# Patient Record
Sex: Male | Born: 1945 | Race: White | Hispanic: No | Marital: Married | Smoking: Former smoker
Health system: Southern US, Community
[De-identification: ages and names within clinical notes are randomized; demographics above are authoritative.]

## PROBLEM LIST (undated history)

## (undated) DIAGNOSIS — I10 Essential (primary) hypertension: Secondary | ICD-10-CM

## (undated) DIAGNOSIS — E785 Hyperlipidemia, unspecified: Secondary | ICD-10-CM

## (undated) DIAGNOSIS — F419 Anxiety disorder, unspecified: Secondary | ICD-10-CM

## (undated) DIAGNOSIS — H269 Unspecified cataract: Secondary | ICD-10-CM

## (undated) DIAGNOSIS — J439 Emphysema, unspecified: Secondary | ICD-10-CM

## (undated) DIAGNOSIS — K5790 Diverticulosis of intestine, part unspecified, without perforation or abscess without bleeding: Secondary | ICD-10-CM

## (undated) DIAGNOSIS — M1990 Unspecified osteoarthritis, unspecified site: Secondary | ICD-10-CM

## (undated) DIAGNOSIS — Z87438 Personal history of other diseases of male genital organs: Secondary | ICD-10-CM

## (undated) DIAGNOSIS — F17200 Nicotine dependence, unspecified, uncomplicated: Secondary | ICD-10-CM

## (undated) DIAGNOSIS — K635 Polyp of colon: Secondary | ICD-10-CM

## (undated) DIAGNOSIS — T7840XA Allergy, unspecified, initial encounter: Secondary | ICD-10-CM

## (undated) DIAGNOSIS — F32A Depression, unspecified: Secondary | ICD-10-CM

## (undated) HISTORY — DX: Diverticulosis of intestine, part unspecified, without perforation or abscess without bleeding: K57.90

## (undated) HISTORY — DX: Depression, unspecified: F32.A

## (undated) HISTORY — DX: Unspecified cataract: H26.9

## (undated) HISTORY — DX: Unspecified osteoarthritis, unspecified site: M19.90

## (undated) HISTORY — PX: EYE SURGERY: SHX253

## (undated) HISTORY — DX: Personal history of other diseases of male genital organs: Z87.438

## (undated) HISTORY — PX: COLONOSCOPY: SHX174

## (undated) HISTORY — DX: Essential (primary) hypertension: I10

## (undated) HISTORY — DX: Anxiety disorder, unspecified: F41.9

## (undated) HISTORY — PX: CATARACT EXTRACTION, BILATERAL: SHX1313

## (undated) HISTORY — DX: Allergy, unspecified, initial encounter: T78.40XA

## (undated) HISTORY — DX: Hyperlipidemia, unspecified: E78.5

## (undated) HISTORY — DX: Emphysema, unspecified: J43.9

## (undated) HISTORY — DX: Polyp of colon: K63.5

## (undated) HISTORY — DX: Nicotine dependence, unspecified, uncomplicated: F17.200

## (undated) HISTORY — PX: VASECTOMY: SHX75

---

## 2001-09-03 HISTORY — PX: ROTATOR CUFF REPAIR: SHX139

## 2001-10-06 ENCOUNTER — Encounter: Payer: Self-pay | Admitting: Orthopedic Surgery

## 2001-10-06 ENCOUNTER — Ambulatory Visit (HOSPITAL_COMMUNITY): Admission: RE | Admit: 2001-10-06 | Discharge: 2001-10-06 | Payer: Self-pay | Admitting: Orthopedic Surgery

## 2003-01-15 ENCOUNTER — Encounter: Payer: Self-pay | Admitting: Gastroenterology

## 2005-01-26 ENCOUNTER — Ambulatory Visit: Payer: Self-pay | Admitting: Gastroenterology

## 2005-02-16 ENCOUNTER — Encounter (INDEPENDENT_AMBULATORY_CARE_PROVIDER_SITE_OTHER): Payer: Self-pay | Admitting: Specialist

## 2005-02-16 ENCOUNTER — Ambulatory Visit: Payer: Self-pay | Admitting: Gastroenterology

## 2005-03-23 ENCOUNTER — Encounter: Admission: RE | Admit: 2005-03-23 | Discharge: 2005-03-23 | Payer: Self-pay | Admitting: Sports Medicine

## 2006-04-25 ENCOUNTER — Ambulatory Visit (HOSPITAL_COMMUNITY): Admission: RE | Admit: 2006-04-25 | Discharge: 2006-04-25 | Payer: Self-pay | Admitting: Urology

## 2006-04-25 ENCOUNTER — Encounter: Payer: Self-pay | Admitting: Vascular Surgery

## 2009-08-15 ENCOUNTER — Encounter: Admission: RE | Admit: 2009-08-15 | Discharge: 2009-08-15 | Payer: Self-pay | Admitting: Nephrology

## 2009-12-12 ENCOUNTER — Encounter: Admission: RE | Admit: 2009-12-12 | Discharge: 2009-12-12 | Payer: Self-pay | Admitting: Sports Medicine

## 2009-12-26 ENCOUNTER — Encounter: Admission: RE | Admit: 2009-12-26 | Discharge: 2009-12-26 | Payer: Self-pay | Admitting: Sports Medicine

## 2010-01-19 ENCOUNTER — Encounter (INDEPENDENT_AMBULATORY_CARE_PROVIDER_SITE_OTHER): Payer: Self-pay | Admitting: *Deleted

## 2010-06-23 ENCOUNTER — Encounter: Admission: RE | Admit: 2010-06-23 | Discharge: 2010-06-23 | Payer: Self-pay | Admitting: Sports Medicine

## 2010-09-06 ENCOUNTER — Encounter (INDEPENDENT_AMBULATORY_CARE_PROVIDER_SITE_OTHER): Payer: Self-pay | Admitting: *Deleted

## 2010-09-24 ENCOUNTER — Encounter: Payer: Self-pay | Admitting: Sports Medicine

## 2010-10-03 NOTE — Letter (Signed)
Summary: Colonoscopy Letter  [facility]  [address] [place], [place] [address]   Phone: [phone]  Fax: [phone]      Jan 19, 2010 MRN: [id number]   Johnny Brown [address] [place], [place]  [address]   Dear Mr. Johnny Brown,   According to your medical record, it is time for you to schedule a Colonoscopy. The American Cancer Society recommends this procedure as a method to detect early colon cancer. Patients with a family history of colon cancer, or a personal history of colon polyps or inflammatory bowel disease are at increased risk.  This letter has beeen generated based on the recommendations made at the time of your procedure. If you feel that in your particular situation this may no longer apply, please contact our office.  Please call our office at [phone] to schedule this appointment or to update your records at your earliest convenience.  Thank you for cooperating with [place] to provide you with the very best care possible.   Sincerely,    Vania Rea. Jarold Motto, M.D.  [facility] Gastroenterology Division [phone]

## 2010-10-05 NOTE — Letter (Signed)
Summary: Pre Visit Letter Revised  [facility]  [address] [place], [place] [address]   Phone: [phone]  Fax: [phone]        09/06/2010 MRN: [id number] Johnny Brown [address] [place], [place]  [address]             Procedure Date:  10-16-10   Welcome to the Gastroenterology Division at [facility].    You are scheduled to see a nurse for your pre-procedure visit on 10-02-10 at 11:00a.m. on the 3rd floor at [facility], [address]  We ask that you try to arrive at our office 15 minutes prior to your appointment time to allow for check-in.  Please take a minute to review the attached form.  If you answer "Yes" to one or more of the questions on the first page, we ask that you call the person listed at your earliest opportunity.  If you answer "No" to all of the questions, please complete the rest of the form and bring it to your appointment.    Your nurse visit will consist of discussing your medical and surgical history, your immediate family medical history, and your medications.   If you are unable to list all of your medications on the form, please bring the medication bottles to your appointment and we will list them.  We will need to be aware of both prescribed and over the counter drugs.  We will need to know exact dosage information as well.    Please be prepared to read and sign documents such as consent forms, a financial agreement, and acknowledgement forms.  If necessary, and with your consent, a friend or relative is welcome to sit-in on the nurse visit with you.  Please bring your insurance card so that we may make a copy of it.  If your insurance requires a referral to see a specialist, please bring your referral form from your primary care physician.  No co-pay is required for this nurse visit.     If you cannot keep your appointment, please call [phone] to cancel or reschedule prior to your appointment date.  This allows [place]  the opportunity to schedule an appointment for another patient in need of care.    Thank you for choosing [facility] for your medical needs.  We appreciate the opportunity to care for you.  Please visit [place] at our website  to learn more about our practice.  Sincerely, The Gastroenterology Division

## 2010-10-09 ENCOUNTER — Encounter (INDEPENDENT_AMBULATORY_CARE_PROVIDER_SITE_OTHER): Payer: Self-pay | Admitting: *Deleted

## 2010-10-10 ENCOUNTER — Encounter: Payer: Self-pay | Admitting: Gastroenterology

## 2010-10-13 ENCOUNTER — Telehealth: Payer: Self-pay | Admitting: Gastroenterology

## 2010-10-16 ENCOUNTER — Other Ambulatory Visit: Payer: Self-pay | Admitting: Gastroenterology

## 2010-10-16 ENCOUNTER — Other Ambulatory Visit (AMBULATORY_SURGERY_CENTER): Payer: BC Managed Care – PPO | Admitting: Gastroenterology

## 2010-10-16 DIAGNOSIS — Z86010 Personal history of colonic polyps: Secondary | ICD-10-CM

## 2010-10-16 DIAGNOSIS — Z1211 Encounter for screening for malignant neoplasm of colon: Secondary | ICD-10-CM

## 2010-10-16 DIAGNOSIS — D126 Benign neoplasm of colon, unspecified: Secondary | ICD-10-CM

## 2010-10-16 DIAGNOSIS — K5730 Diverticulosis of large intestine without perforation or abscess without bleeding: Secondary | ICD-10-CM

## 2010-10-19 NOTE — Miscellaneous (Signed)
Summary: [facility] Previsit/prep  Clinical Lists Changes  Medications: Added new medication of MOVIPREP 100 GM  SOLR (PEG-KCL-NACL-NASULF-NA ASC-C) As per prep instructions. - Signed Rx of MOVIPREP 100 GM  SOLR (PEG-KCL-NACL-NASULF-NA ASC-C) As per prep instructions.;  #1 x 0;  Signed;  Entered by: Wyona Almas RN;  Authorized by: Mardella Layman MD [facility];  Method used: Electronically to [facility]. [address] *, [address] [place], [place]  [address], Ph: [phone] or [phone], Fax: [phone] Observations: Added new observation of NKA: T (10/10/2010 14:16)    Prescriptions: MOVIPREP 100 GM  SOLR (PEG-KCL-NACL-NASULF-NA ASC-C) As per prep instructions.  #1 x 0   Entered by:   Wyona Almas RN   Authorized by:   Mardella Layman MD [facility]   Signed by:   Wyona Almas RN on 10/10/2010   Method used:   Electronically to        [facility]. [address] * (retail)       [address]       [place], [place]  [address]       Ph: [phone] or [phone]       Fax: [phone]   RxID:   [id number]

## 2010-10-19 NOTE — Progress Notes (Signed)
Summary: Colon Mond no prep meds  Phone Note Call from Patient   Call For: Dr Jarold Motto Summary of Call: Colon monday has no prep meds uses [facility]  in [place]  Initial call taken by: Leanor Kail [facility],  October 13, 2010 1:04 PM  Follow-up for Phone Call        pt. stated that my wife must have called. Infomed pt. that prep was sent to [facility] at [facility] pt. stated that he will call his wife and tell her that she can pick the prep up at [facility]. Follow-up by: Greer Ee RN,  October 13, 2010 1:49 PM

## 2010-10-19 NOTE — Letter (Signed)
Summary: [facility] Instructions  [facility]  [address] [place], [place] [address]   Phone: [phone]  Fax: [phone]       Johnny Brown    [birth year]    MRN: [id number]        Procedure Day Dorna Bloom:  Duanne Limerick  10/16/10     Arrival Time:  8:30AM     Procedure Time:  9:30AM     Location of Procedure:                    _ X_  [facility] (4th Floor)                        PREPARATION FOR COLONOSCOPY WITH MOVIPREP   Starting 5 days prior to your procedure 10/11/10 do not eat nuts, seeds, popcorn, corn, beans, peas,  salads, or any raw vegetables.  Do not take any fiber supplements (e.g. Metamucil, Citrucel, and Benefiber).  THE DAY BEFORE YOUR PROCEDURE         DATE: 10/15/10  DAY: SUNDAY  1.  Drink clear liquids the entire day-NO SOLID FOOD  2.  Do not drink anything colored red or purple.  Avoid juices with pulp.  No orange juice.  3.  Drink at least 64 oz. (8 glasses) of fluid/clear liquids during the day to prevent dehydration and help the prep work efficiently.  CLEAR LIQUIDS INCLUDE: Water Jello Ice Popsicles Tea (sugar ok, no milk/cream) Powdered fruit flavored drinks Coffee (sugar ok, no milk/cream) Gatorade Juice: apple, white grape, white cranberry  Lemonade Clear bullion, consomm, broth Carbonated beverages (any kind) Strained chicken noodle soup Hard Candy                             4.  In the morning, mix first dose of MoviPrep solution:    Empty 1 Pouch A and 1 Pouch B into the disposable container    Add lukewarm drinking water to the top line of the container. Mix to dissolve    Refrigerate (mixed solution should be used within 24 hrs)  5.  Begin drinking the prep at 5:00 p.m. The MoviPrep container is divided by 4 marks.   Every 15 minutes drink the solution down to the next mark (approximately 8 oz) until the full liter is complete.   6.  Follow completed prep with 16 oz of clear liquid of your choice (Nothing  red or purple).  Continue to drink clear liquids until bedtime.  7.  Before going to bed, mix second dose of MoviPrep solution:    Empty 1 Pouch A and 1 Pouch B into the disposable container    Add lukewarm drinking water to the top line of the container. Mix to dissolve    Refrigerate  THE DAY OF YOUR PROCEDURE      DATE: 10/16/10   DAY: MONDAY  Beginning at 4:30AM (5 hours before procedure):         1. Every 15 minutes, drink the solution down to the next mark (approx 8 oz) until the full liter is complete.  2. Follow completed prep with 16 oz. of clear liquid of your choice.    3. You may drink clear liquids until 7:30AM (2 HOURS BEFORE PROCEDURE).   MEDICATION INSTRUCTIONS  Unless otherwise instructed, you should take regular prescription medications with a small sip of water   as early as possible the  morning of your procedure.        OTHER INSTRUCTIONS  You will need a responsible adult at least 65 years of age to accompany you and drive you home.   This person must remain in the waiting room during your procedure.  Wear loose fitting clothing that is easily removed.  Leave jewelry and other valuables at home.  However, you may wish to bring a book to read or  an iPod/MP3 player to listen to music as you wait for your procedure to start.  Remove all body piercing jewelry and leave at home.  Total time from sign-in until discharge is approximately 2-3 hours.  You should go home directly after your procedure and rest.  You can resume normal activities the  day after your procedure.  The day of your procedure you should not:   Drive   Make legal decisions   Operate machinery   Drink alcohol   Return to work  You will receive specific instructions about eating, activities and medications before you leave.    The above instructions have been reviewed and explained to me by   Wyona Almas RN  October 10, 2010 3:08 PM     I fully understand and can  verbalize these instructions _____________________________ Date _________

## 2010-10-20 ENCOUNTER — Encounter: Payer: Self-pay | Admitting: Gastroenterology

## 2010-10-25 NOTE — Procedures (Addendum)
Summary: Colonoscopy  Patient: [name] Note: All result statuses are Final unless otherwise noted.  Tests: (1) Colonoscopy (COL)   COL Colonoscopy           DONE     [facility]     [address]     [place], [place]  [address]           COLONOSCOPY PROCEDURE REPORT           PATIENT:  Johnny, Brown  MR#:  [id number]     BIRTHDATE:  11/13/1945, 64 yrs. old  GENDER:  male     ENDOSCOPIST:  Vania Rea. Jarold Motto, MD, [facility]     REF. BY:     PROCEDURE DATE:  10/16/2010     PROCEDURE:  Colonoscopy with biopsy     ASA CLASS:  Class II     INDICATIONS:  history of hyperplastic polyps     MEDICATIONS:   Fentanyl 100 mcg IV, Versed 10 mg IV           DESCRIPTION OF PROCEDURE:   After the risks benefits and     alternatives of the procedure were thoroughly explained, informed     consent was obtained.  Digital rectal exam was performed and     revealed no abnormalities.   The LB 180AL [id number] endoscope was     introduced through the anus and advanced to the cecum, which was     identified by both the appendix and ileocecal valve, without     limitations.  The quality of the prep was excellent, using     MoviPrep.  The instrument was then slowly withdrawn as the colon     was fully examined.     <<PROCEDUREIMAGES>>           FINDINGS:  ENDOSCOPIC FINDINGS:   There were multiple polyps     identified and removed. in the rectum and sigmoid colon. -2 MM     FLAT HYPERPLASTIC NOTED.MULTIPLE BIOPSIES OF THESE WERE DONE.     Moderate diverticulosis was found in the sigmoid to descending     colon segments.  This was otherwise a normal examination of the     colon.   Retroflexed views in the rectum revealed no     abnormalities.    The scope was then withdrawn from the patient     and the procedure completed.           COMPLICATIONS:  None     ENDOSCOPIC IMPRESSION:     1) Moderate diverticulosis in the sigmoid to descending colon     segments     2) Polyps, multiple in the  rectum and sigmoid colon     3) Otherwise normal examination     HYPERPLASTIC LEFT COLON POLYPS.R/O ADENOMAS.     RECOMMENDATIONS:     1) Await biopsy results     2) Repeat colonoscopy in 5 years if polyp adenomatous; otherwise     10 years     REPEAT EXAM:  No           ______________________________     Vania Rea. Jarold Motto, MD, [facility]           CC:           n.     eSIGNED:   Vania Rea. Bertina Guthridge at 10/16/2010 10:10 AM           Quenten Raven, [id number]  Note: An exclamation mark (!) indicates a  result that was not dispersed into the flowsheet. Document Creation Date: 10/16/2010 10:11 AM _______________________________________________________________________  (1) Order result status: Final Collection or observation date-time: 10/16/2010 10:03 Requested date-time:  Receipt date-time:  Reported date-time:  Referring Physician:   Ordering Physician: Sheryn Bison [phone]) Specimen Source:  Source: Launa Grill Order Number: [id number] Lab site:   Appended Document: Colonoscopy 10y f/u  Appended Document: Colonoscopy     Procedures Next Due Date:    Colonoscopy: 10/2020

## 2010-10-31 NOTE — Letter (Signed)
Summary: Patient Notice- Polyp Results  [facility]  [address] [place], [place] [address]   Phone: [phone]  Fax: [phone]        October 20, 2010 MRN: [id number]    LORIS WINROW [address] [place], [place]  [address]    Dear Mr. Leadbetter,  I am pleased to inform you that the colon polyp(s) removed during your recent colonoscopy was (were) found to be benign (no cancer detected) upon pathologic examination.  I recommend you have a repeat colonoscopy examination in 10_ years to look for recurrent polyps, as having colon polyps increases your risk for having recurrent polyps or even colon cancer in the future.  Should you develop new or worsening symptoms of abdominal pain, bowel habit changes or bleeding from the rectum or bowels, please schedule an evaluation with either your primary care physician or with me.  Additional information/recommendations:  _x_ No further action with gastroenterology is needed at this time. Please      follow-up with your primary care physician for your other healthcare      needs.  __ Please call [phone] to schedule a return visit to review your      situation.  __ Please keep your follow-up visit as already scheduled.  __ Continue treatment plan as outlined the day of your exam.  Please call us if you are having persistent problems or have questions about your condition that have not been fully answered at this time.  Sincerely,  Mardella Layman MD [facility]  This letter has been electronically signed by your physician.  Appended Document: Patient Notice- Polyp Results letter mailed

## 2011-03-22 ENCOUNTER — Encounter: Payer: Self-pay | Admitting: Cardiovascular Disease

## 2011-03-22 ENCOUNTER — Ambulatory Visit (INDEPENDENT_AMBULATORY_CARE_PROVIDER_SITE_OTHER): Payer: BC Managed Care – PPO | Admitting: Cardiovascular Disease

## 2011-03-22 VITALS — BP 104/70 | HR 65 | Ht 69.0 in | Wt 203.8 lb

## 2011-03-22 DIAGNOSIS — M79609 Pain in unspecified limb: Secondary | ICD-10-CM

## 2011-03-22 DIAGNOSIS — M79673 Pain in unspecified foot: Secondary | ICD-10-CM

## 2011-03-22 DIAGNOSIS — R0609 Other forms of dyspnea: Secondary | ICD-10-CM

## 2011-03-22 DIAGNOSIS — R0989 Other specified symptoms and signs involving the circulatory and respiratory systems: Secondary | ICD-10-CM

## 2011-03-22 DIAGNOSIS — F17290 Nicotine dependence, other tobacco product, uncomplicated: Secondary | ICD-10-CM | POA: Insufficient documentation

## 2011-03-22 DIAGNOSIS — F17200 Nicotine dependence, unspecified, uncomplicated: Secondary | ICD-10-CM

## 2011-03-22 DIAGNOSIS — R0600 Dyspnea, unspecified: Secondary | ICD-10-CM

## 2011-03-22 NOTE — Assessment & Plan Note (Signed)
I recommend that he stop smoking. This will help reduce his chances of having coronary artery disease or peripheral vascular disease.

## 2011-03-22 NOTE — Assessment & Plan Note (Signed)
The patient complains of having foot pain. His pulses are palpable. Probably has had a duplex study which reveals no evidence of ischemia. We will let his medical doctor address this.  This could be due to a peripheral neuropathy.

## 2011-03-22 NOTE — Assessment & Plan Note (Signed)
Pt has a history of smoking and chronic dyspnea.  Will order a stress myoview.  Pt denies any chest pain.  I have recommended that he quit smoking and exercise.  I've asked him to work on a good diet and exercise program.  Will see him in 6 months.

## 2011-03-22 NOTE — Progress Notes (Signed)
Johnny Brown Date of Birth  [birth year] [facility] [address]     [address] [place], [place]  [address] [phone]  Fax  [phone]  History of Present Illness:  65 yo with hx of smoking - complains of dyspnea.  + dyspnea with walking up stairs.  Resolves fairly quickly. No angina.  No syncope.  Constant feet pain - especially when he goes to bed.   He has had a lower extremity duplex scan which was negative for ischemia.  He is a retired [employer].    Current Outpatient Prescriptions  Medication Sig Dispense Refill  . DOXAZOSIN MESYLATE PO Take 4 mg by mouth at bedtime and may repeat dose one time if needed.        . Losartan Potassium-HCTZ (HYZAAR PO) Take 100 mg by mouth daily.           Allergies  Allergen Reactions  . Lisinopril     HCTZ     Past Medical History  Diagnosis Date  . Hypertension     UNSPE  . History of BPH   . Hyperlipidemia     MIXED  . Hypertension, benign   . Hypertension, essential   . Tobacco use disorder     Past Surgical History  Procedure Date  . Rotator cuff repair 2003    History  Smoking status  . Current Everyday Smoker -- 5 years  . Types: Pipe  Smokeless tobacco  . Not on file    History  Alcohol Use No    Family History  Problem Relation Age of Onset  . Lung cancer Father     Reviw of Systems:  Reviewed in the HPI.  All other systems are negative.  Physical Exam: BP 104/70  Pulse 65  Ht 5\' 9"  (1.753 m)  Wt 203 lb 12.8 oz (92.443 kg)  BMI 30.10 kg/m2 The patient is alert and oriented x 3.  The mood and affect are normal.   Skin: warm and dry.  Color is normal.    HEENT:   the sclera are nonicteric.  The mucous membranes are moist.  The carotids are 2+ without bruits.  There is no thyromegaly.  There is no JVD.    Lungs: clear.  The chest wall is non tender.    Heart: regular rate with a normal S1 and S2.  There are no murmurs, gallops, or rubs. The PMI is  not displaced.     Abdomin: good bowel sounds.  There is no guarding or rebound.  There is no hepatosplenomegaly or tenderness.  There are no masses.   Extremities:  no clubbing, cyanosis, or edema.  The legs are without rashes.  The distal pulses are intact.   Neuro:  Cranial nerves II - XII are intact.  Motor and sensory functions are intact.    The gait is normal.  ECG: NSR, R axis dev.    Assessment / Plan:

## 2011-04-02 ENCOUNTER — Encounter (HOSPITAL_COMMUNITY): Payer: BC Managed Care – PPO | Admitting: Radiology

## 2011-04-04 ENCOUNTER — Ambulatory Visit (HOSPITAL_COMMUNITY): Payer: BC Managed Care – PPO | Attending: Cardiovascular Disease | Admitting: Radiology

## 2011-04-04 DIAGNOSIS — F17200 Nicotine dependence, unspecified, uncomplicated: Secondary | ICD-10-CM | POA: Insufficient documentation

## 2011-04-04 DIAGNOSIS — F17290 Nicotine dependence, other tobacco product, uncomplicated: Secondary | ICD-10-CM

## 2011-04-04 DIAGNOSIS — R0600 Dyspnea, unspecified: Secondary | ICD-10-CM

## 2011-04-04 DIAGNOSIS — R0602 Shortness of breath: Secondary | ICD-10-CM

## 2011-04-04 DIAGNOSIS — R0989 Other specified symptoms and signs involving the circulatory and respiratory systems: Secondary | ICD-10-CM | POA: Insufficient documentation

## 2011-04-04 DIAGNOSIS — R0609 Other forms of dyspnea: Secondary | ICD-10-CM | POA: Insufficient documentation

## 2011-04-04 MED ORDER — TECHNETIUM TC 99M TETROFOSMIN IV KIT
33.0000 | PACK | Freq: Once | INTRAVENOUS | Status: AC | PRN
  Administered 2011-04-04: 33 via INTRAVENOUS

## 2011-04-04 MED ORDER — TECHNETIUM TC 99M TETROFOSMIN IV KIT
11.0000 | PACK | Freq: Once | INTRAVENOUS | Status: AC | PRN
  Administered 2011-04-04: 11 via INTRAVENOUS

## 2011-04-04 MED ORDER — REGADENOSON 0.4 MG/5ML IV SOLN
0.4000 mg | Freq: Once | INTRAVENOUS | Status: AC
  Administered 2011-04-04: 0.4 mg via INTRAVENOUS

## 2011-04-04 NOTE — Progress Notes (Addendum)
Johnny Brown is a 65 y.o. male [id number] 06/03/1946   Nuclear Med Background Indication for Stress Test:  Evaluation for Ischemia History:  No previous documented CAD and '88 GXT: Carbon Nl per pt Cardiac Risk Factors: Hypertension, Lipids and Smoker  Symptoms:  DOE, Fatigue and SOB   Nuclear Pre-Procedure Caffeine/Decaff Intake:  None NPO After: 8:30pm   Lungs:  clear IV 0.9% NS with Angio Cath:  20g  IV Site: R Antecubital  IV Started by:  Stanton Kidney, EMT-P  Chest Size (in):  44 Cup Size: n/a  Height: 5\' 9"  (1.753 m)  Weight:  205 lb (92.987 kg)  BMI:  Body mass index is 30.27 kg/(m^2). Tech Comments: This patient was changed to a walking Lexiscan, because his back was hurting him where his bulging disk was located.    Nuclear Med Study 1 or 2 day study: 1 day  Stress Test Type:  Treadmill/Lexiscan  Reading MD: Charlton Haws, MD  Order Authorizing Provider:  P.Nahser  Resting Radionuclide: Technetium 32m Tetrofosmin  Resting Radionuclide Dose: 10.9 mCi   Stress Radionuclide:  Technetium 16m Tetrofosmin  Stress Radionuclide Dose: 33.0 mCi           Stress Protocol Rest HR:  Stress HR: 98  Rest BP: 142 67 Stress BP: 142 67  Exercise Time (min): n/a METS: n/a   Predicted Max HR: 156 bpm % Max HR: 70.51 bpm Rate Pressure Product: 21308    Dose of Lexiscan: 0.4 mg   Dose of Dobutamine: n/a mcg/kg/min (at max HR)  Stress Test Technologist: Irean Hong, RN  Nuclear Technologist:  Leonia Corona, RT-N     Rest Procedure:  Myocardial perfusion imaging was performed at rest 45 minutes following the intravenous administration of Technetium 56m Tetrofosmin. Rest ECG: NSR - Normal EKG  Stress Procedure:  The patient received IV Lexiscan 0.4 mg over 15-seconds with concurrent low level exercise and then Technetium 24m Tetrofosmin was injected at  30-seconds while the patient continued walking one more minute.  There were no significant changes with Lexiscan.  Quantitative spect images were obtained after a 45-minute delay. Stress ECG: No significant change from baseline ECG  QPS Raw Data Images:  Normal; no motion artifact; normal heart/lung ratio. Stress Images:  Normal homogeneous uptake in all areas of the myocardium. Rest Images:  Normal homogeneous uptake in all areas of the myocardium. Subtraction (SDS):  Normal Transient Ischemic Dilatation (Normal <1.22):  0.95 Lung/Heart Ratio (Normal <0.45):  0.35  Quantitative Gated Spect Images QGS EDV:  78 ml QGS ESV:  24 ml QGS cine images:  NL LV Function; NL Wall Motion QGS EF: 69%  Impression Exercise Capacity:  Lexiscan with no exercise. BP Response:  Normal blood pressure response. Clinical Symptoms:  There is dyspnea. ECG Impression:  No significant ST segment change suggestive of ischemia. Comparison with Prior Nuclear Study: No images to compare  Overall Impression:  Normal stress nuclear study.     Charlton Haws   Please report normal study.  04/06/11 Vesta Mixer, Montez Hageman., MD, [facility]

## 2011-04-05 NOTE — Progress Notes (Signed)
Nuclear report routed to Dr Nahser. Johnny Brown  

## 2011-04-06 ENCOUNTER — Telehealth: Payer: Self-pay | Admitting: *Deleted

## 2011-04-06 NOTE — Telephone Encounter (Signed)
Patient called with nuc results. Pt verbalized understanding. Alfonso Ramus RN

## 2015-01-13 ENCOUNTER — Encounter: Payer: Self-pay | Admitting: Gastroenterology

## 2016-04-23 ENCOUNTER — Ambulatory Visit
Admission: RE | Admit: 2016-04-23 | Discharge: 2016-04-23 | Disposition: A | Discharge status: Home / Self Care | Payer: Medicare PPO | Source: Ambulatory Visit | Attending: Sports Medicine | Admitting: Sports Medicine

## 2016-04-23 ENCOUNTER — Ambulatory Visit (INDEPENDENT_AMBULATORY_CARE_PROVIDER_SITE_OTHER): Payer: Medicare PPO | Admitting: Sports Medicine

## 2016-04-23 VITALS — BP 148/66 | Ht 69.0 in | Wt 210.0 lb

## 2016-04-23 DIAGNOSIS — M5442 Lumbago with sciatica, left side: Secondary | ICD-10-CM | POA: Diagnosis not present

## 2016-04-23 DIAGNOSIS — M5441 Lumbago with sciatica, right side: Secondary | ICD-10-CM | POA: Diagnosis not present

## 2016-04-23 MED ORDER — GABAPENTIN 300 MG PO CAPS: 300.0000 mg | ORAL_CAPSULE | Freq: Every day | ORAL | 0 refills | Status: DC

## 2016-04-23 MED ORDER — DIAZEPAM 10 MG PO TABS: ORAL_TABLET | ORAL | 0 refills | Status: DC

## 2016-04-23 NOTE — Progress Notes (Signed)
Subjective:    Patient ID: Johnny Brown , male   DOB: February 07, 1946 , 70 y.o..   MRN: [id number]  HPI  Johnny Brown is here for back pain and bilateral leg pain.  Patient has had back pain since ~2009. The pain is mostly in his lower back, denies any previous trauma or inciting event. Over the last few years the back pain has become increasingly worse and over the last couple months he has developed "burning" pain in bilateral legs. The "burning" pain starts from his lower back and radiates to his feet. His back pain is worse with ambulation, particularly when he is walking up an incline or up and down stairs. Patient does not like taking medications but has tried Aleve and Mobic in the past which has provided minimal relief. Admits to some unsteady gait and recent falls. The most recent fall was a couple days ago when he was getting off a tractor and he fell. Someone was there to catch him thankfully. He sometimes "stumbles" over things when he is walking as he feels his "knees give out". Denies any numbness, urinary incontinence, stool incontinence, or weakness.    Review of Systems: Per HPI. All other systems reviewed and are negative.   Past Medical History: Patient Active Problem List   Diagnosis Date Noted  . Dyspnea 03/22/2011  . Pipe smoker 03/22/2011  . Foot pain 03/22/2011    Medications: reviewed and updated Current Outpatient Prescriptions  Medication Sig Dispense Refill  . amLODipine (NORVASC) 5 MG tablet     . diazepam (VALIUM) 10 MG tablet Take 1 tab 2-hours prior to MRI 2 tablet 0  . doxazosin (CARDURA) 4 MG tablet     . DOXAZOSIN MESYLATE PO Take 4 mg by mouth at bedtime and may repeat dose one time if needed.      Marland Kitchen FLUoxetine (PROZAC) 20 MG capsule     . gabapentin (NEURONTIN) 300 MG capsule Take 1 capsule (300 mg total) by mouth at bedtime. 60 capsule 0  . Losartan Potassium-HCTZ (HYZAAR PO) Take 100 mg by mouth daily.      Marland Kitchen losartan-hydrochlorothiazide  (HYZAAR) 100-25 MG tablet     . simvastatin (ZOCOR) 20 MG tablet      No current facility-administered medications for this visit.     Social Hx:  reports that he has been smoking Pipe.  He has smoked for the past 5.00 years. He does not have any smokeless tobacco history on file.    Objective:   BP (!) 148/66   Ht 5\' 9"  (1.753 m)   Wt 210 lb (95.3 kg)   BMI 31.01 kg/m  Physical Exam  Gen: NAD, alert, cooperative with exam, well-appearing MSK:  Back Exam:  Inspection: Unremarkable  Palpable tenderness: Pain with palpation of lumbar spinous process, no paraspinal tenderness Range of Motion: Flexion slightly restricted at 30 deg; Extension 45 deg; Side Bending to 45 deg bilaterally; Rotation to 45 deg bilaterally  Leg strength: Quad: 5/5 Hamstring: 5/5 Hip flexor: 5/5 Hip abductors: 5/5  Strength at foot: Plantar-flexion: 5/5 Dorsi-flexion: 5/5 Eversion: 5/5 Inversion: 5/5  Sensory change: Gross sensation intact to all lumbar and sacral dermatomes.  Reflexes: 2+ at both patellar tendons, 2+ at achilles tendons, Babinski's downgoing.  Gait unremarkable. 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 Other  differential include lumbar disc herniation. Patient is agreeable to obtain further imaging. Discussed red flag symptoms and return precautions. - Begin Gabapentin 300 mg QHS  - Imaging: XRAY L spine and MRI L Spine  - Will give Valium 10 mg for MRI due to claustrophobia - Follow up in 1-2 weeks   Johnny Cords, MD [place], PGY-2  Patient seen and evaluated with the resident. I agree with the above plan of care. Patient has a known history of degenerative disc disease and spinal stenosis seen on MRI in 2011. X-rays done on August 21 also show a mild T12 compression fracture of indeterminate age. I  would like to proceed with an MRI scan specifically to further evaluate the mild compression fracture seen on x-ray as well as to determine the degree of spinal stenosis present. I will follow-up with the patient the a telephone after I reviewed the MRI. In the meantime, we will try some gabapentin for his neuropathic leg pain.

## 2016-05-02 ENCOUNTER — Ambulatory Visit
Admission: RE | Admit: 2016-05-02 | Discharge: 2016-05-02 | Disposition: A | Discharge status: Home / Self Care | Payer: Medicare PPO | Source: Ambulatory Visit | Attending: Sports Medicine | Admitting: Sports Medicine

## 2016-05-02 DIAGNOSIS — M5441 Lumbago with sciatica, right side: Secondary | ICD-10-CM

## 2016-05-02 DIAGNOSIS — M5442 Lumbago with sciatica, left side: Principal | ICD-10-CM

## 2016-05-15 ENCOUNTER — Ambulatory Visit (INDEPENDENT_AMBULATORY_CARE_PROVIDER_SITE_OTHER): Payer: Medicare PPO | Admitting: Sports Medicine

## 2016-05-15 ENCOUNTER — Encounter: Payer: Self-pay | Admitting: Sports Medicine

## 2016-05-15 VITALS — BP 143/55 | HR 56 | Ht 69.0 in | Wt 210.0 lb

## 2016-05-15 DIAGNOSIS — M47816 Spondylosis without myelopathy or radiculopathy, lumbar region: Secondary | ICD-10-CM

## 2016-05-15 NOTE — Progress Notes (Signed)
  Patient comes in today with his wife to discuss MRI findings of his lumbar spine. Main finding is some mild to moderate facet hypertrophy from L3-S1. He has no significant spinal stenosis and no appreciable significant foraminal stenosis. There does not appear to be any sort of acute compression fracture at T12. The burning pain in his legs has resolved with 300 mg of gabapentin at night but he is still complaining of low back pain especially with ambulation. He has had epidural injections in the past with no significant improvement (2011). I recommended that we try a series of facet injections for diagnostic as well as therapeutic reasons. I will order a single injection and I've asked the patient and his wife to contact me about a week after the procedure. If he does get some symptom relief then we may need to consider repeat injections. I did reassure both of them that he does not have surgical pathology present on his MRI. I've also encouraged him to stay as active as possible.  Total time spent with the patient was 10 minutes with greater than 50% of the time spent in face-to-face consultation discussing his diagnosis and treatment options.

## 2016-05-16 ENCOUNTER — Other Ambulatory Visit: Payer: Self-pay | Admitting: Sports Medicine

## 2016-05-16 DIAGNOSIS — M25552 Pain in left hip: Secondary | ICD-10-CM

## 2016-05-23 ENCOUNTER — Other Ambulatory Visit: Payer: Medicare PPO

## 2016-05-30 ENCOUNTER — Ambulatory Visit
Admission: RE | Admit: 2016-05-30 | Discharge: 2016-05-30 | Disposition: A | Discharge status: Home / Self Care | Payer: Medicare PPO | Source: Ambulatory Visit | Attending: Sports Medicine | Admitting: Sports Medicine

## 2016-05-30 ENCOUNTER — Other Ambulatory Visit: Payer: Self-pay | Admitting: Sports Medicine

## 2016-05-30 DIAGNOSIS — M25552 Pain in left hip: Secondary | ICD-10-CM

## 2016-05-30 MED ORDER — IOPAMIDOL (ISOVUE-M 200) INJECTION 41%
1.0000 mL | Freq: Once | INTRAMUSCULAR | Status: AC
  Administered 2016-05-30: 1 mL via INTRA_ARTICULAR

## 2016-05-30 MED ORDER — METHYLPREDNISOLONE ACETATE 40 MG/ML INJ SUSP (RADIOLOG
120.0000 mg | Freq: Once | INTRAMUSCULAR | Status: AC
  Administered 2016-05-30: 120 mg via INTRA_ARTICULAR

## 2016-05-30 NOTE — Discharge Instructions (Signed)

## 2017-09-19 ENCOUNTER — Encounter: Payer: Self-pay | Admitting: Cardiovascular Disease

## 2017-09-19 ENCOUNTER — Ambulatory Visit: Payer: Medicare PPO | Admitting: Cardiovascular Disease

## 2017-09-19 ENCOUNTER — Encounter (INDEPENDENT_AMBULATORY_CARE_PROVIDER_SITE_OTHER): Payer: Self-pay

## 2017-09-19 VITALS — BP 155/72 | HR 74 | Ht 69.0 in | Wt 208.0 lb

## 2017-09-19 DIAGNOSIS — E782 Mixed hyperlipidemia: Secondary | ICD-10-CM

## 2017-09-19 DIAGNOSIS — I10 Essential (primary) hypertension: Secondary | ICD-10-CM | POA: Diagnosis not present

## 2017-09-19 DIAGNOSIS — R0609 Other forms of dyspnea: Secondary | ICD-10-CM

## 2017-09-19 NOTE — Progress Notes (Signed)
Cardiology Office Note:    Date:  09/19/2017   ID:  KEYVIN RISON, DOB Dec 11, 1945, MRN [id number]  PCP:  Andres Shad, MD  Cardiologist:   Lisvet Rasheed    Referring MD: Andres Shad, *   Chief Complaint  Patient presents with  . Shortness of Breath   Problem List 1. Dyspnea 2. Essential HTN 3, back pain    History of Present Illness:    Johnny Brown is a 72 y.o. male with a hx of DOE .     Ive seen him for CP in the past.   myoview was normal.  Now has had several months of DOE.   Has significant DOE walking up from his dog lot.  No DOE carrying groceries in from the car  Has not been exercising  No CP or tightness.   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   Takes his meds regularly .    Still smokes  A pipe .   Advised him to stop    Past Medical History:  Diagnosis Date  . History of BPH   . Hyperlipidemia    MIXED  . Hypertension    UNSPE  . Hypertension, benign   . Hypertension, essential   . Tobacco use disorder     Past Surgical History:  Procedure Laterality Date  . ROTATOR CUFF REPAIR  2003    Current Medications: Current Meds  Medication Sig  . amLODipine (NORVASC) 2.5 MG tablet Take 1 tablet by mouth daily.  Marland Kitchen doxazosin (CARDURA) 4 MG tablet Take 4 mg by mouth daily.  Marland Kitchen FLUoxetine (PROZAC) 20 MG capsule   . losartan-hydrochlorothiazide (HYZAAR) 100-25 MG tablet Take 1 tablet by mouth daily.   . simvastatin (ZOCOR) 20 MG tablet      Allergies:   Lisinopril   Social History   Socioeconomic History  . Marital status: Single    Spouse name: None  . Number of children: None  . Years of education: None  . Highest education level: None  Social Needs  . Financial resource strain: None  . Food insecurity - worry: None  . Food insecurity - inability: None  . Transportation needs - medical: None  . Transportation needs - non-medical: None  Occupational History  . None  Tobacco Use  . Smoking  status: Current Every Day Smoker    Years: 5.00    Types: Pipe  . Smokeless tobacco: Never Used  Substance and Sexual Activity  . Alcohol use: No  . Drug use: None  . Sexual activity: None  Other Topics Concern  . None  Social History Narrative  . None     Family History: The patient's family history includes Lung cancer in his father.  ROS:   Please see the history of present illness.     All other systems reviewed and are negative.  EKGs/Labs/Other Studies Reviewed:    The following studies were reviewed today:   EKG:   Jan. 17 2019    NSR at 52.   Prolonged QT - 503 ms.   Recent Labs: No results found for requested labs within last 8760 hours.  Recent Lipid Panel No results found for: CHOL, TRIG, HDL, CHOLHDL, VLDL, LDLCALC, LDLDIRECT  Physical Exam:    VS:  BP (!) 155/72   Pulse 74   Ht 5\' 9"  (1.753 m)   Wt 208 lb (94.3 kg)   SpO2 97%   BMI  30.72 kg/m     Wt Readings from Last 3 Encounters:  09/19/17 208 lb (94.3 kg)  05/15/16 210 lb (95.3 kg)  04/23/16 210 lb (95.3 kg)     GEN:  Obese, elderly male , NAD  HEENT: Normal NECK: No JVD; No carotid bruits LYMPHATICS: No lymphadenopathy CARDIAC: RR, no murmurs, rubs, gallops RESPIRATORY:  Clear to auscultation without rales, wheezing or rhonchi  ABDOMEN: Soft, non-tender, non-distended MUSCULOSKELETAL:  No edema; No deformity  SKIN: Warm and dry NEUROLOGIC:  Alert and oriented x 3 PSYCHIATRIC:  Normal affect   ASSESSMENT:    No diagnosis found. PLAN:    In order of problems listed above:  1. 1.  Shortness of breath with exertion.  Shadd has a history of mild chest pains.  He had a Myoview study in 2012 which was low risk.  He did have some very mild but a very small area of apical attenuation.  I would like to repeat the CT scan Myoview study evaluate him for the possibility of ischemia.  At all also like to do an echocardiogram.  He has a long history of hypertension that has not been adequately  controlled. He likely has some degree of congestive heart failure.  I have encouraged him to greatly decrease his salt intake.  2.  Essential hypertension: [place] is on several medications.  He eats a fairly unrestricted and high salt diet.  I encouraged him to watch his salt intake.  I have encouraged him to exercise.  I will see him in 3 months for follow-up visit.  Medication Adjustments/Labs and Tests Ordered: Current medicines are reviewed at length with the patient today.  Concerns regarding medicines are outlined above.  No orders of the defined types were placed in this encounter.  No orders of the defined types were placed in this encounter.   Signed, Mertie Moores, MD  09/19/2017 8:59 AM    [place]

## 2017-09-19 NOTE — Patient Instructions (Signed)
Medication Instructions:  Your physician recommends that you continue on your current medications as directed. Please refer to the Current Medication list given to you today.   Labwork: TODAY - TSH, BMET   Testing/Procedures: Your physician has requested that you have a lexiscan myoview. For further information please visit [email or web address]. Please follow instruction sheet, as given.  Your physician has requested that you have an echocardiogram. Echocardiography is a painless test that uses sound waves to create images of your heart. It provides your doctor with information about the size and shape of your heart and how well your heart's chambers and valves are working. This procedure takes approximately one hour. There are no restrictions for this procedure.   Follow-Up: Your physician recommends that you schedule a follow-up appointment in: 3 months with Dr. Acie Fredrickson   If you need a refill on your cardiac medications before your next appointment, please call your pharmacy.   Thank you for choosing [facility]! Christen Bame, RN [phone]

## 2017-09-20 LAB — BASIC METABOLIC PANEL
BUN / CREAT RATIO: 17 (ref 10–24)
BUN: 19 mg/dL (ref 8–27)
CO2: 23 mmol/L (ref 20–29)
CREATININE: 1.09 mg/dL (ref 0.76–1.27)
Calcium: 9.4 mg/dL (ref 8.6–10.2)
Chloride: 102 mmol/L (ref 96–106)
GFR, EST AFRICAN AMERICAN: 79 mL/min/{1.73_m2} (ref >59)
GFR, EST NON AFRICAN AMERICAN: 68 mL/min/{1.73_m2} (ref >59)
GLUCOSE: 121 mg/dL — AB (ref 65–99)
Potassium: 3.7 mmol/L (ref 3.5–5.2)
Sodium: 142 mmol/L (ref 134–144)

## 2017-09-20 LAB — TSH: TSH: 2.14 u[IU]/mL (ref 0.450–4.500)

## 2017-09-23 ENCOUNTER — Telehealth: Payer: Self-pay | Admitting: Cardiovascular Disease

## 2017-09-23 NOTE — Telephone Encounter (Signed)
Informed patient of lab results

## 2017-09-23 NOTE — Telephone Encounter (Signed)
Mrs.Dressel is returning a call about test results. Please call

## 2017-09-24 ENCOUNTER — Telehealth (HOSPITAL_COMMUNITY): Payer: Self-pay | Admitting: *Deleted

## 2017-09-24 NOTE — Telephone Encounter (Signed)
Patient given detailed instructions per Myocardial Perfusion Study Information Sheet for the test on 09/27/17  Patient notified to arrive 15 minutes early and that it is imperative to arrive on time for appointment to keep from having the test rescheduled.  If you need to cancel or reschedule your appointment, please call the office within 24 hours of your appointment. . Patient verbalized understanding. Johnny Brown

## 2017-09-27 ENCOUNTER — Other Ambulatory Visit: Payer: Self-pay

## 2017-09-27 ENCOUNTER — Ambulatory Visit (HOSPITAL_BASED_OUTPATIENT_CLINIC_OR_DEPARTMENT_OTHER): Payer: Medicare PPO

## 2017-09-27 ENCOUNTER — Ambulatory Visit (HOSPITAL_COMMUNITY): Payer: Medicare PPO | Attending: Cardiovascular Disease

## 2017-09-27 DIAGNOSIS — R0609 Other forms of dyspnea: Secondary | ICD-10-CM

## 2017-09-27 DIAGNOSIS — E785 Hyperlipidemia, unspecified: Secondary | ICD-10-CM | POA: Insufficient documentation

## 2017-09-27 DIAGNOSIS — I10 Essential (primary) hypertension: Secondary | ICD-10-CM | POA: Diagnosis not present

## 2017-09-27 DIAGNOSIS — R0600 Dyspnea, unspecified: Secondary | ICD-10-CM | POA: Insufficient documentation

## 2017-09-27 LAB — MYOCARDIAL PERFUSION IMAGING
CHL CUP NUCLEAR SDS: 1
CHL CUP NUCLEAR SRS: 1
CSEPPHR: 100 {beats}/min
LV dias vol: 117 mL (ref 62–150)
LV sys vol: 53 mL
NUC STRESS TID: 1
RATE: 0.35
Rest HR: 70 {beats}/min
SSS: 2

## 2017-09-27 MED ORDER — TECHNETIUM TC 99M TETROFOSMIN IV KIT
32.5000 | PACK | Freq: Once | INTRAVENOUS | Status: AC | PRN
  Administered 2017-09-27: 32.5 via INTRAVENOUS
  Filled 2017-09-27: qty 33

## 2017-09-27 MED ORDER — TECHNETIUM TC 99M TETROFOSMIN IV KIT
10.1000 | PACK | Freq: Once | INTRAVENOUS | Status: AC | PRN
  Administered 2017-09-27: 10.1 via INTRAVENOUS
  Filled 2017-09-27: qty 11

## 2017-09-27 MED ORDER — REGADENOSON 0.4 MG/5ML IV SOLN
0.4000 mg | Freq: Once | INTRAVENOUS | Status: AC
  Administered 2017-09-27: 0.4 mg via INTRAVENOUS

## 2017-12-23 ENCOUNTER — Ambulatory Visit: Payer: Medicare PPO | Admitting: Cardiovascular Disease

## 2017-12-31 ENCOUNTER — Encounter: Payer: Self-pay | Admitting: Cardiovascular Disease

## 2018-01-20 ENCOUNTER — Ambulatory Visit: Payer: Medicare PPO | Admitting: Cardiovascular Disease

## 2018-01-20 ENCOUNTER — Encounter (INDEPENDENT_AMBULATORY_CARE_PROVIDER_SITE_OTHER): Payer: Self-pay

## 2018-01-20 ENCOUNTER — Encounter: Payer: Self-pay | Admitting: Cardiovascular Disease

## 2018-01-20 VITALS — BP 146/70 | HR 63 | Ht 69.0 in | Wt 211.4 lb

## 2018-01-20 DIAGNOSIS — R0602 Shortness of breath: Secondary | ICD-10-CM

## 2018-01-20 DIAGNOSIS — E782 Mixed hyperlipidemia: Secondary | ICD-10-CM | POA: Diagnosis not present

## 2018-01-20 NOTE — Progress Notes (Signed)
Cardiology Office Note:    Date:  01/20/2018   ID:  Brown, Johnny Feb 25, 1946, MRN [id number]  PCP:  Andres Shad, MD  Cardiologist:   Nychelle Cassata    Referring MD: Andres Shad, *   Chief Complaint  Patient presents with  . Shortness of Breath   Problem List 1. Dyspnea 2. Essential HTN 3, back pain        Johnny Brown is a 72 y.o. male with a hx of DOE .     Ive seen him for CP in the past.   myoview was normal.  Now has had several months of DOE.   Has significant DOE walking up from his dog lot.  No DOE carrying groceries in from the car  Has not been exercising  No CP or tightness.   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   Takes his meds regularly .    Still smokes  A pipe .   Advised him to stop   Jan 20, 2018:  Johnny Brown is seen today for follow-up of his shortness of breath and hypertension. Echocardiogram in January, 2019 reveals normal left ventricular systolic function.  He has grade 1 diastolic dysfunction. Stress Myoview study revealed an ejection fraction of 54%.  There was no evidence of ischemia.  It was a low risk study.  Not getting much exercise,   Has gained some weight.   Looking forward to exercising - he and his wife are going to join a gym soon.  BP has been ok.   Still eats salty foods   Past Medical History:  Diagnosis Date  . History of BPH   . Hyperlipidemia    MIXED  . Hypertension    UNSPE  . Hypertension, benign   . Hypertension, essential   . Tobacco use disorder     Past Surgical History:  Procedure Laterality Date  . ROTATOR CUFF REPAIR  2003    Current Medications: Current Meds  Medication Sig  . amLODipine (NORVASC) 2.5 MG tablet Take 1 tablet by mouth daily.  Marland Kitchen doxazosin (CARDURA) 4 MG tablet Take 4 mg by mouth daily.  Marland Kitchen FLUoxetine (PROZAC) 20 MG capsule Take 20 mg by mouth daily.   Marland Kitchen gabapentin (NEURONTIN) 300 MG capsule Take 2 capsules by mouth daily.  Marland Kitchen  losartan-hydrochlorothiazide (HYZAAR) 100-25 MG tablet Take 1 tablet by mouth daily.   . simvastatin (ZOCOR) 20 MG tablet Take 20 mg by mouth daily at 6 PM.      Allergies:   Lisinopril   Social History   Socioeconomic History  . Marital status: Single    Spouse name: Not on file  . Number of children: Not on file  . Years of education: Not on file  . Highest education level: Not on file  Occupational History  . Not on file  Social Needs  . Financial resource strain: Not on file  . Food insecurity:    Worry: Not on file    Inability: Not on file  . Transportation needs:    Medical: Not on file    Non-medical: Not on file  Tobacco Use  . Smoking status: Current Every Day Smoker    Years: 5.00    Types: Pipe  . Smokeless tobacco: Never Used  Substance and Sexual Activity  . Alcohol use: No  . Drug use: Never  . Sexual activity: Not on file  Lifestyle  . Physical activity:  Days per week: Not on file    Minutes per session: Not on file  . Stress: Not on file  Relationships  . Social connections:    Talks on phone: Not on file    Gets together: Not on file    Attends religious service: Not on file    Active member of club or organization: Not on file    Attends meetings of clubs or organizations: Not on file    Relationship status: Not on file  Other Topics Concern  . Not on file  Social History Narrative  . Not on file     Family History: The patient's family history includes Lung cancer in his father.  ROS:   Please see the history of present illness.     All other systems reviewed and are negative.  EKGs/Labs/Other Studies Reviewed:    The following studies were reviewed today:   EKG:   Jan. 17 2019    NSR at 56.   Prolonged QT - 503 ms.   Recent Labs: 09/19/2017: BUN 19; Creatinine, Ser 1.09; Potassium 3.7; Sodium 142; TSH 2.140  Recent Lipid Panel No results found for: CHOL, TRIG, HDL, CHOLHDL, VLDL, LDLCALC, LDLDIRECT  Physical Exam:     Physical Exam: Blood pressure (!) 146/70, pulse 63, height 5\' 9"  (1.753 m), weight 211 lb 6.4 oz (95.9 kg), SpO2 96 %.  GEN:  Well nourished, well developed in no acute distress HEENT: Normal NECK: No JVD; No carotid bruits LYMPHATICS: No lymphadenopathy CARDIAC: RRR RESPIRATORY:  Clear to auscultation without rales, wheezing or rhonchi  ABDOMEN: Soft, non-tender, non-distended MUSCULOSKELETAL:  No edema; No deformity  SKIN: Warm and dry NEUROLOGIC:  Alert and oriented x 3   ASSESSMENT:    No diagnosis found. PLAN:    In order of problems listed above:  1.  Shortness of breath with exertion.    myoview was low risk .   2.  Essential hypertension:  - BP is slightly elevated this am Still eating salty foods.  Needs to exercise   Medication Adjustments/Labs and Tests Ordered: Current medicines are reviewed at length with the patient today.  Concerns regarding medicines are outlined above.  No orders of the defined types were placed in this encounter.  No orders of the defined types were placed in this encounter.   Signed, Mertie Moores, MD  01/20/2018 10:22 AM    [facility]

## 2018-01-20 NOTE — Patient Instructions (Signed)

## 2018-09-03 HISTORY — PX: COLONOSCOPY: SHX174

## 2018-09-03 HISTORY — PX: POLYPECTOMY: SHX149

## 2019-04-03 ENCOUNTER — Telehealth: Payer: Self-pay | Admitting: Internal Medicine

## 2019-04-03 NOTE — Telephone Encounter (Signed)
Hi Dr. Hilarie Fredrickson, This patient called today looking to make an appointment for constipation, He is a former Dr. Shon Baton patient and he would like to continue his GI care with you. Is it fine to schedule him with you? Thank you.

## 2019-04-04 NOTE — Telephone Encounter (Signed)
Yes, this is fine. Due to my schedule, my next available appt isn't until Sept 2020 That said, an APP visit is likely available earlier which could work -- he would still be my patient

## 2019-04-06 ENCOUNTER — Encounter: Payer: Self-pay | Admitting: Internal Medicine

## 2019-04-06 NOTE — Telephone Encounter (Signed)
Left message to call back to schedule appt

## 2019-04-30 ENCOUNTER — Encounter: Payer: Self-pay | Admitting: *Deleted

## 2019-05-05 ENCOUNTER — Encounter: Payer: Self-pay | Admitting: Gastroenterology

## 2019-05-05 ENCOUNTER — Other Ambulatory Visit (INDEPENDENT_AMBULATORY_CARE_PROVIDER_SITE_OTHER): Payer: Medicare PPO

## 2019-05-05 ENCOUNTER — Ambulatory Visit: Payer: Medicare PPO | Admitting: Gastroenterology

## 2019-05-05 VITALS — BP 140/60 | HR 76 | Temp 97.4°F | Ht 69.0 in | Wt 215.2 lb

## 2019-05-05 DIAGNOSIS — R140 Abdominal distension (gaseous): Secondary | ICD-10-CM | POA: Diagnosis not present

## 2019-05-05 DIAGNOSIS — R194 Change in bowel habit: Secondary | ICD-10-CM | POA: Insufficient documentation

## 2019-05-05 DIAGNOSIS — R1900 Intra-abdominal and pelvic swelling, mass and lump, unspecified site: Secondary | ICD-10-CM

## 2019-05-05 DIAGNOSIS — K5909 Other constipation: Secondary | ICD-10-CM | POA: Diagnosis not present

## 2019-05-05 LAB — BASIC METABOLIC PANEL
BUN: 20 mg/dL (ref 6–23)
CO2: 27 mEq/L (ref 19–32)
Calcium: 9.6 mg/dL (ref 8.4–10.5)
Chloride: 102 mEq/L (ref 96–112)
Creatinine, Ser: 1.13 mg/dL (ref 0.40–1.50)
GFR: 63.61 mL/min (ref >60.00)
Glucose, Bld: 87 mg/dL (ref 70–99)
Potassium: 3.3 mEq/L — ABNORMAL LOW (ref 3.5–5.1)
Sodium: 138 mEq/L (ref 135–145)

## 2019-05-05 MED ORDER — SUPREP BOWEL PREP KIT 17.5-3.13-1.6 GM/177ML PO SOLN: 1.0000 | ORAL | 0 refills | Status: DC

## 2019-05-05 NOTE — Patient Instructions (Signed)
You have been scheduled for a colonoscopy. Please follow written instructions given to you at your visit today.  Please pick up your prep supplies at the pharmacy within the next 1-3 days. If you use inhalers (even only as needed), please bring them with you on the day of your procedure. ______________________________________________________________________ Your provider has requested that you go to the basement level for lab work before leaving today. Press "B" on the elevator. The lab is located at the first door on the left as you exit the elevator. ______________________________________________________________________ Please purchase the following medications over the counter and take as directed: Miralax 1 capful (17 grams) daily in at least 8 oz water/juice daily. May titrate to 2 capfuls daily if needed. ______________________________________________________________________ Dennis Bast have been scheduled for a CT scan of the abdomen and pelvis at [facility] ([address]---this is in the same building as Press [facility]).   You are scheduled on 05/06/19 at 2:00 pm. You should arrive 15 minutes prior to your appointment time for registration. Please follow the written instructions below on the day of your exam:  WARNING: IF YOU ARE ALLERGIC TO IODINE/X-RAY DYE, PLEASE NOTIFY RADIOLOGY IMMEDIATELY AT [phone]! YOU WILL BE GIVEN A 13 HOUR PREMEDICATION PREP.  1) Do not eat or drink anything after 10:00 am (4 hours prior to your test) 2) You have been given 2 bottles of oral contrast to drink. The solution may taste better if refrigerated, but do NOT add ice or any other liquid to this solution. Shake well before drinking.    Drink 1 bottle of contrast @ 12:00 pm (2 hours prior to your exam)  Drink 1 bottle of contrast @ 1:00 pm (1 hour prior to your exam)  You may take any medications as prescribed with a small amount of water, if necessary. If you take any of the following  medications: METFORMIN, GLUCOPHAGE, GLUCOVANCE, AVANDAMET, RIOMET, FORTAMET, [place] MET, JANUMET, GLUMETZA or METAGLIP, you MAY be asked to HOLD this medication 48 hours AFTER the exam.  The purpose of you drinking the oral contrast is to aid in the visualization of your intestinal tract. The contrast solution may cause some diarrhea. Depending on your individual set of symptoms, you may also receive an intravenous injection of x-ray contrast/dye. Plan on being at [facility] for 30 minutes or longer, depending on the type of exam you are having performed.  This test typically takes 30-45 minutes to complete.  If you have any questions regarding your exam or if you need to reschedule, you may call the CT department at [phone] between the hours of 8:00 am and 5:00 pm, Monday-Friday.  ________________________________________________________________________ If you are age 73 or older, your body mass index should be between 23-30. Your Body mass index is 31.78 kg/m. If this is out of the aforementioned range listed, please consider follow up with your Primary Care Provider.  If you are age 32 or younger, your body mass index should be between 19-25. Your Body mass index is 31.78 kg/m. If this is out of the aformentioned range listed, please consider follow up with your Primary Care Provider.

## 2019-05-05 NOTE — Progress Notes (Signed)
05/05/2019 JASRAJ GALLIAN [id number] March 04, 1946   HISTORY OF PRESENT ILLNESS: This is a 73 year old male who is previously a patient of Dr. Buel Ream.  His care will be assumed by Dr. Hilarie Fredrickson.  I am seeing him today in Dr. Vena Rua absence.  He is here today with complaints of abdominal swelling/bloating and constipation.  He tells me that over the past 6 months or so his bowel habits started changing.  He says he used to have a bowel movement every day and now he does not go regularly unless he takes some type of over-the-counter laxative.  He is having to use these regularly and even with those sometimes is hard to go and stools are hard in consistency.  He denies seeing any blood in his stools.  He also complains of feeling very bloated.  He says his belly gets very tight.  He says he thinks he is gained a couple of pounds in the past couple of weeks, but overall may be 10 to 12 pounds over the past 4 to 6 months.  He says if he has a good bowel movement then his belly seems to get a little bit softer afterwards, but still remains very large and protuberant, much more than it used to be.  He also complains of a bulge in the middle of his belly when he lifts his head and shoulders up off the table.  He says that this big belly has caused him to feel short of breath at times.  Last colonoscopy with Dr. Sharlett Iles 10/2010 at which time he had some hyperplastic polyps removed, also had moderate diverticulosis.  He mentions very rare, occasional dysphasia to pills if he takes too many at a time and does not drink enough fluids with it.  He has had some issues with feeling cornbread get stuck, or slow to go down, on 1 or 2 occasions as well, but does not have any dysphasia to any other items or anything on any consistent or regular basis.  He says that he's had blood work performed by his PCP within the past couple of months.   Past Medical History:  Diagnosis Date  . Anxiety   . Arthritis   .  Diverticulosis   . History of BPH   . Hyperlipidemia    MIXED  . Hyperplastic colonic polyp   . Hypertension    UNSPE  . Hypertension, benign   . Hypertension, essential   . Tobacco use disorder    Past Surgical History:  Procedure Laterality Date  . ROTATOR CUFF REPAIR Right 2003  . VASECTOMY      reports that he has quit smoking. His smoking use included pipe. He quit after 5.00 years of use. He has never used smokeless tobacco. He reports that he does not drink alcohol or use drugs. family history includes Colon cancer in his paternal uncle; Colon polyps in his son; Heart attack in his father; Liver cancer in his brother; Lung cancer in his father. Allergies  Allergen Reactions  . Lisinopril     cough       Outpatient Encounter Medications as of 05/05/2019  Medication Sig  . amLODipine (NORVASC) 2.5 MG tablet Take 1 tablet by mouth daily.  . Cyanocobalamin (VITAMIN B-12 PO) Take 1 capsule by mouth daily.  Marland Kitchen doxazosin (CARDURA) 4 MG tablet Take 4 mg by mouth daily.  Marland Kitchen FLUoxetine (PROZAC) 20 MG capsule Take 20 mg by mouth daily.   Marland Kitchen losartan-hydrochlorothiazide (HYZAAR) 100-25 MG  tablet Take 1 tablet by mouth daily.   . simvastatin (ZOCOR) 20 MG tablet Take 20 mg by mouth daily at 6 PM.   . [DISCONTINUED] gabapentin (NEURONTIN) 300 MG capsule Take 2 capsules by mouth daily.   No facility-administered encounter medications on file as of 05/05/2019.      REVIEW OF SYSTEMS  : All other systems reviewed and negative except where noted in the History of Present Illness.   PHYSICAL EXAM: BP 140/60   Pulse 76   Temp (!) 97.4 F (36.3 C)   Ht 5\' 9"  (1.753 m)   Wt 215 lb 3.2 oz (97.6 kg)   BMI 31.78 kg/m  General: Well developed white male in no acute distress Head: Normocephalic and atraumatic Eyes:  Sclerae anicteric, conjunctiva pink. Ears: Normal auditory acuity Lungs: Clear throughout to auscultation; no increased WOB. Heart: Regular rate and rhythm; no M/R/G.  Abdomen: Soft, protuberant, somewhat distended.  BS present.  Non-tender.  Diastasis recti noted.  ? Fluid wave. Rectal:  Will be done at the time of colonoscopy. Musculoskeletal: Symmetrical with no gross deformities  Skin: No lesions on visible extremities Extremities: No edema  Neurological: Alert oriented x 4, grossly non-focal Psychological:  Alert and cooperative. Normal mood and affect  ASSESSMENT AND PLAN: *Change in bowel habits with constipation over the past 6 months or so, progressively worsening:  Likely motility issue, but last colonoscopy was 8.5 years ago.  Will plan for colonoscopy with Dr. Hilarie Fredrickson.  Will have him start Miralax daily and increase to twice daily if needed. *Distended abdomen, bloating, ? Ascites:  Reports 10-12 pound weight gain.  No history of liver disease.  Will check CT scan abdomen and pelvis with contrast.    **Will obtain recent lab results from PCP's office.  Likely will need BMP today for CT scan as he says last labs were a couple of months ago.   CC:  Andres Shad, *

## 2019-05-06 ENCOUNTER — Other Ambulatory Visit: Payer: Self-pay

## 2019-05-06 ENCOUNTER — Ambulatory Visit (INDEPENDENT_AMBULATORY_CARE_PROVIDER_SITE_OTHER)
Admission: RE | Admit: 2019-05-06 | Discharge: 2019-05-06 | Disposition: A | Discharge status: Home / Self Care | Payer: Medicare PPO | Source: Ambulatory Visit | Attending: Gastroenterology | Admitting: Gastroenterology

## 2019-05-06 DIAGNOSIS — R1900 Intra-abdominal and pelvic swelling, mass and lump, unspecified site: Secondary | ICD-10-CM

## 2019-05-06 DIAGNOSIS — R194 Change in bowel habit: Secondary | ICD-10-CM

## 2019-05-06 MED ORDER — IOHEXOL 300 MG/ML  SOLN
100.0000 mL | Freq: Once | INTRAMUSCULAR | Status: AC | PRN
  Administered 2019-05-06: 100 mL via INTRAVENOUS

## 2019-05-14 ENCOUNTER — Encounter: Payer: Self-pay | Admitting: Internal Medicine

## 2019-05-22 ENCOUNTER — Telehealth: Payer: Self-pay | Admitting: Internal Medicine

## 2019-05-22 NOTE — Telephone Encounter (Signed)

## 2019-05-25 ENCOUNTER — Other Ambulatory Visit: Payer: Self-pay | Admitting: Internal Medicine

## 2019-05-25 ENCOUNTER — Other Ambulatory Visit: Payer: Self-pay

## 2019-05-25 ENCOUNTER — Ambulatory Visit (AMBULATORY_SURGERY_CENTER): Payer: Medicare PPO | Admitting: Internal Medicine

## 2019-05-25 ENCOUNTER — Encounter: Payer: Self-pay | Admitting: Internal Medicine

## 2019-05-25 VITALS — BP 175/85 | HR 61 | Temp 97.7°F | Resp 15 | Ht 69.0 in | Wt 215.0 lb

## 2019-05-25 DIAGNOSIS — D123 Benign neoplasm of transverse colon: Secondary | ICD-10-CM | POA: Diagnosis not present

## 2019-05-25 DIAGNOSIS — R194 Change in bowel habit: Secondary | ICD-10-CM

## 2019-05-25 DIAGNOSIS — D122 Benign neoplasm of ascending colon: Secondary | ICD-10-CM

## 2019-05-25 DIAGNOSIS — D124 Benign neoplasm of descending colon: Secondary | ICD-10-CM

## 2019-05-25 DIAGNOSIS — D120 Benign neoplasm of cecum: Secondary | ICD-10-CM

## 2019-05-25 DIAGNOSIS — D128 Benign neoplasm of rectum: Secondary | ICD-10-CM

## 2019-05-25 MED ORDER — SODIUM CHLORIDE 0.9 % IV SOLN: 500.0000 mL | Freq: Once | INTRAVENOUS | Status: DC

## 2019-05-25 NOTE — Op Note (Signed)
[facility] Patient Name: Zaydin Aughenbaugh Procedure Date: 05/25/2019 2:39 PM MRN: [id number] Endoscopist: Jerene Bears , MD Age: 73 Referring MD:  Date of Birth: 12/14/1945 Gender: Male Account #: [id number] Procedure:                Colonoscopy Indications:              Change in bowel habits Medicines:                Monitored Anesthesia Care Procedure:                Pre-Anesthesia Assessment:                           - Prior to the procedure, a History and Physical                            was performed, and patient medications and                            allergies were reviewed. The patient's tolerance of                            previous anesthesia was also reviewed. The risks                            and benefits of the procedure and the sedation                            options and risks were discussed with the patient.                            All questions were answered, and informed consent                            was obtained. Prior Anticoagulants: The patient has                            taken no previous anticoagulant or antiplatelet                            agents. ASA Grade Assessment: III - A patient with                            severe systemic disease. After reviewing the risks                            and benefits, the patient was deemed in                            satisfactory condition to undergo the procedure.                           After obtaining informed consent, the colonoscope  was passed under direct vision. Throughout the                            procedure, the patient's blood pressure, pulse, and                            oxygen saturations were monitored continuously. The                            Colonoscope was introduced through the anus and                            advanced to the cecum, identified by appendiceal                            orifice and ileocecal valve. The  colonoscopy was                            performed without difficulty. The patient tolerated                            the procedure well. The quality of the bowel                            preparation was good. The ileocecal valve,                            appendiceal orifice, and rectum were photographed. Scope In: 2:52:46 PM Scope Out: 3:29:48 PM Scope Withdrawal Time: 0 hours 34 minutes 24 seconds  Total Procedure Duration: 0 hours 37 minutes 2 seconds  Findings:                 The digital rectal exam was normal.                           Three sessile polyps were found in the cecum. The                            polyps were 7 to 15 mm in size. These polyps were                            removed with a cold snare. Resection and retrieval                            were complete.                           Three sessile polyps were found in the ascending                            colon. The polyps were 5 to 15 mm in size. These                            polyps were  removed with a cold snare. Resection                            and retrieval were complete.                           Two sessile polyps were found in the transverse                            colon. The polyps were 5 to 6 mm in size. These                            polyps were removed with a cold snare. Resection                            and retrieval were complete.                           A 5 mm polyp was found in the descending colon. The                            polyp was sessile. The polyp was removed with a                            cold snare. Resection and retrieval were complete.                           A 6 mm polyp was found in the rectum. The polyp was                            sessile. The polyp was removed with a cold snare.                            Resection and retrieval were complete.                           Multiple small and large-mouthed diverticula were                             found from sigmoid to ascending colon.                           Internal hemorrhoids were found during                            retroflexion. The hemorrhoids were small. Complications:            No immediate complications. Estimated Blood Loss:     Estimated blood loss was minimal. Impression:               - Three 7 to 15 mm polyps in the cecum, removed                            with a cold  snare. Resected and retrieved.                           - Three 5 to 15 mm polyps in the ascending colon,                            removed with a cold snare. Resected and retrieved.                           - Two 5 to 6 mm polyps in the transverse colon,                            removed with a cold snare. Resected and retrieved.                           - One 5 mm polyp in the descending colon, removed                            with a cold snare. Resected and retrieved.                           - One 6 mm polyp in the rectum, removed with a cold                            snare. Resected and retrieved.                           - Diverticulosis from sigmoid to ascending colon.                           - Internal hemorrhoids. Recommendation:           - Patient has a contact number available for                            emergencies. The signs and symptoms of potential                            delayed complications were discussed with the                            patient. Return to normal activities tomorrow.                            Written discharge instructions were provided to the                            patient.                           - Resume previous diet.                           - Continue present medications.                           -  Await pathology results.                           - MiraLax 17 grams can be used once to twice daily                            as needed for constipation. If still having                            troublesome abdominal  bloating, constipation, or                            pain please contact my office for follow-up.                           - Repeat colonoscopy is recommended for                            surveillance. The colonoscopy date will be                            determined after pathology results from today's                            exam become available for review. Jerene Bears, MD 05/25/2019 3:35:17 PM This report has been signed electronically.

## 2019-05-25 NOTE — Patient Instructions (Signed)
Handout on polyps, diverticulosis and hemorrhoids given. Use Miralax 17 grams 1-2 times daily as needed for constipation.   YOU HAD AN ENDOSCOPIC PROCEDURE TODAY AT [facility]:   Refer to the procedure report that was given to you for any specific questions about what was found during the examination.  If the procedure report does not answer your questions, please call your gastroenterologist to clarify.  If you requested that your care partner not be given the details of your procedure findings, then the procedure report has been included in a sealed envelope for you to review at your convenience later.  YOU SHOULD EXPECT: Some feelings of bloating in the abdomen. Passage of more gas than usual.  Walking can help get rid of the air that was put into your GI tract during the procedure and reduce the bloating. If you had a lower endoscopy (such as a colonoscopy or flexible sigmoidoscopy) you may notice spotting of blood in your stool or on the toilet paper. If you underwent a bowel prep for your procedure, you may not have a normal bowel movement for a few days.  Please Note:  You might notice some irritation and congestion in your nose or some drainage.  This is from the oxygen used during your procedure.  There is no need for concern and it should clear up in a day or so.  SYMPTOMS TO REPORT IMMEDIATELY:   Following lower endoscopy (colonoscopy or flexible sigmoidoscopy):  Excessive amounts of blood in the stool  Significant tenderness or worsening of abdominal pains  Swelling of the abdomen that is new, acute  Fever of 100F or higher   For urgent or emergent issues, a gastroenterologist can be reached at any hour by calling [phone].   DIET:  We do recommend a small meal at first, but then you may proceed to your regular diet.  Drink plenty of fluids but you should avoid alcoholic beverages for 24 hours.  ACTIVITY:  You should plan to take it easy for the rest of  today and you should NOT DRIVE or use heavy machinery until tomorrow (because of the sedation medicines used during the test).    FOLLOW UP: Our staff will call the number listed on your records 48-72 hours following your procedure to check on you and address any questions or concerns that you may have regarding the information given to you following your procedure. If we do not reach you, we will leave a message.  We will attempt to reach you two times.  During this call, we will ask if you have developed any symptoms of COVID 19. If you develop any symptoms (ie: fever, flu-like symptoms, shortness of breath, cough etc.) before then, please call [phone].  If you test positive for Covid 19 in the 2 weeks post procedure, please call and report this information to [place].    If any biopsies were taken you will be contacted by phone or by letter within the next 1-3 weeks.  Please call us at [phone] if you have not heard about the biopsies in 3 weeks.    SIGNATURES/CONFIDENTIALITY: You and/or your care partner have signed paperwork which will be entered into your electronic medical record.  These signatures attest to the fact that that the information above on your After Visit Summary has been reviewed and is understood.  Full responsibility of the confidentiality of this discharge information lies with you and/or your care-partner.

## 2019-05-25 NOTE — Progress Notes (Signed)
Addendum: Reviewed and agree with assessment and management plan. Zetta Stoneman M, MD  

## 2019-05-25 NOTE — Progress Notes (Signed)
Report to PACU, RN, vss, BBS= Clear.  

## 2019-05-25 NOTE — Progress Notes (Signed)
Called to room to assist during endoscopic procedure.  Patient ID and intended procedure confirmed with present staff. Received instructions for my participation in the procedure from the performing physician.  

## 2019-05-25 NOTE — Progress Notes (Signed)
Temperature taken by K.A., VS taken by C.W. 

## 2019-05-27 ENCOUNTER — Telehealth: Payer: Self-pay

## 2019-05-27 NOTE — Telephone Encounter (Signed)
  Follow up Call-  Call back number 05/25/2019  Post procedure Call Back phone  # [phone]  Permission to leave phone message Yes  Some recent data might be hidden     Patient questions:  Do you have a fever, pain , or abdominal swelling? No. Pain Score  0 *  Have you tolerated food without any problems? Yes.    Have you been able to return to your normal activities? Yes.    Do you have any questions about your discharge instructions: Diet   No. Medications  No. Follow up visit  No.  Do you have questions or concerns about your Care? No.  Actions: * If pain score is 4 or above: No action needed, pain <4.  1. Have you developed a fever since your procedure? no  2.   Have you had an respiratory symptoms (SOB or cough) since your procedure? no  3.   Have you tested positive for COVID 19 since your procedure no  4.   Have you had any family members/close contacts diagnosed with the COVID 19 since your procedure?  no   If yes to any of these questions please route to Joylene John, RN and Alphonsa Gin, Therapist, sports.

## 2019-06-01 ENCOUNTER — Encounter: Payer: Self-pay | Admitting: Internal Medicine

## 2020-04-04 ENCOUNTER — Encounter: Payer: Self-pay | Admitting: Cardiovascular Disease

## 2020-04-04 NOTE — Progress Notes (Signed)
Cardiology Office Note:    Date:  04/05/2020   ID:  Johnny Brown 1946/09/01, MRN [id number]  PCP:  Johnny Kaufmann, MD  Cardiologist:   Johnny Brown    Referring MD: Johnny Brown, *   Chief Complaint  Patient presents with  . Shortness of Breath   Problem List 1. Dyspnea 2. Essential HTN 3, back pain        Johnny Brown is a 74 y.o. male with a hx of DOE .     Ive seen him for CP in the past.   myoview was normal.  Now has had several months of DOE.   Has significant DOE walking up from his dog lot.  No DOE carrying groceries in from the car  Has not been exercising  No CP or tightness.   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   Takes his meds regularly .    Still smokes  A pipe .   Advised him to stop   Jan 20, 2018:  Johnny Brown is seen today for follow-up of his shortness of breath and hypertension. Echocardiogram in January, 2019 reveals normal left ventricular systolic function.  He has grade 1 diastolic dysfunction. Stress Myoview study revealed an ejection fraction of 54%.  There was no evidence of ischemia.  It was a low risk study.  Not getting much exercise,   Has gained some weight.   Looking forward to exercising - he and his wife are going to join a gym soon.  BP has been ok.   Still eats salty foods   April 05, 2020:  Johnny Brown is seen today for follow-up visit.  He is a 74 year old gentleman with a history of shortness of breath with exertion.  Myoview study performed in 2021 was low risk.  Echocardiogram in January, 2019 reveals normal left ventricular systolic function.  He has grade 1 diastolic dysfunction.     He stopped smoking a year ago .  Still short of breath.  Not much exercise    Past Medical History:  Diagnosis Date  . Allergy   . Anxiety   . Arthritis   . Cataract   . Diverticulosis   . History of BPH   . Hyperlipidemia    MIXED  . Hyperplastic colonic polyp   . Hypertension    UNSPE  .  Hypertension, benign   . Hypertension, essential   . Tobacco use disorder     Past Surgical History:  Procedure Laterality Date  . COLONOSCOPY    . ROTATOR CUFF REPAIR Right 2003  . VASECTOMY      Current Medications: Current Meds  Medication Sig  . Cyanocobalamin (VITAMIN B-12 PO) Take 1 capsule by mouth daily.  Marland Kitchen doxazosin (CARDURA) 4 MG tablet Take 4 mg by mouth daily.  Marland Kitchen FLUoxetine (PROZAC) 20 MG capsule Take 20 mg by mouth daily.   Marland Kitchen losartan-hydrochlorothiazide (HYZAAR) 100-25 MG tablet Take 1 tablet by mouth daily.   . simvastatin (ZOCOR) 20 MG tablet Take 20 mg by mouth daily at 6 PM.   . [DISCONTINUED] amLODipine (NORVASC) 2.5 MG tablet Take 1 tablet by mouth daily.     Allergies:   Lisinopril   Social History   Socioeconomic History  . Marital status: Single    Spouse name: Not on file  . Number of children: Not on file  . Years of education: Not on file  . Highest education level: Not  on file  Occupational History  . Not on file  Tobacco Use  . Smoking status: Former Smoker    Years: 5.00    Types: Pipe  . Smokeless tobacco: Never Used  Vaping Use  . Vaping Use: Never used  Substance and Sexual Activity  . Alcohol use: No  . Drug use: Never  . Sexual activity: Not on file  Other Topics Concern  . Not on file  Social History Narrative  . Not on file   Social Determinants of Health   Financial Resource Strain:   . Difficulty of Paying Living Expenses:   Food Insecurity:   . Worried About Charity fundraiser in the Last Year:   . Arboriculturist in the Last Year:   Transportation Needs:   . Film/video editor (Medical):   Marland Kitchen Lack of Transportation (Non-Medical):   Physical Activity:   . Days of Exercise per Week:   . Minutes of Exercise per Session:   Stress:   . Feeling of Stress :   Social Connections:   . Frequency of Communication with Friends and Family:   . Frequency of Social Gatherings with Friends and Family:   . Attends  Religious Services:   . Active Member of Clubs or Organizations:   . Attends Archivist Meetings:   Marland Kitchen Marital Status:      Family History: The patient's family history includes Colon cancer in his paternal uncle; Colon polyps in his son; Heart attack in his father; Liver cancer in his brother; Lung cancer in his father. There is no history of Esophageal cancer, Rectal cancer, or Stomach cancer.  ROS:   Please see the history of present illness.     All other systems reviewed and are negative.  EKGs/Labs/Other Studies Reviewed:    The following studies were reviewed today:   EKG:      Recent Labs: 05/05/2019: BUN 20; Creatinine, Ser 1.13; Potassium 3.3; Sodium 138  Recent Lipid Panel No results found for: CHOL, TRIG, HDL, CHOLHDL, VLDL, LDLCALC, LDLDIRECT  Physical Exam:    Physical Exam: Blood pressure 140/70, pulse 64, height 5\' 9"  (1.753 m), weight 228 lb (103.4 kg), SpO2 97 %.  GEN:  Well nourished, well developed in no acute distress HEENT: Normal NECK: No JVD; No carotid bruits LYMPHATICS: No lymphadenopathy CARDIAC: RRR , no murmurs, rubs, gallops RESPIRATORY:  Clear to auscultation without rales, wheezing or rhonchi  ABDOMEN: Soft, non-tender, non-distended MUSCULOSKELETAL:  No edema; No deformity  SKIN: Warm and dry NEUROLOGIC:  Alert and oriented x 3  ECG: April 05, 2020: Normal sinus rhythm at 65.  No ST or T wave changes.  ASSESSMENT:    1. Shortness of breath   2. Essential hypertension   3. Mixed hyperlipidemia    PLAN:      1.  Dysnpea:   No evidence of CAD.  Low risk myovie win 2019   He likely needs to see pulmonary medicine  .   2.  Essential hypertension:  -   BP is mildly elevated.     Still eats a salty diet.  We discussed alternatives.  Will increase amlodipine to 5 mg a day .  He will continue to follow up with his primary MD as well    3.  Hyperlipidemia:  Managed by his primary MD .  Will have his primary md fax over labs  .  Medication Adjustments/Labs and Tests Ordered: Current medicines are reviewed at length with the  patient today.  Concerns regarding medicines are outlined above.  Orders Placed This Encounter  Procedures  . EKG 12-Lead   Meds ordered this encounter  Medications  . amLODipine (NORVASC) 5 MG tablet    Sig: Take 1 tablet (5 mg total) by mouth daily.    Dispense:  90 tablet    Refill:  3    Signed, Mertie Moores, MD  04/05/2020 11:06 AM    [place]

## 2020-04-05 ENCOUNTER — Other Ambulatory Visit: Payer: Self-pay

## 2020-04-05 ENCOUNTER — Encounter: Payer: Self-pay | Admitting: Cardiovascular Disease

## 2020-04-05 ENCOUNTER — Ambulatory Visit: Payer: Medicare PPO | Admitting: Cardiovascular Disease

## 2020-04-05 VITALS — BP 140/70 | HR 64 | Ht 69.0 in | Wt 228.0 lb

## 2020-04-05 DIAGNOSIS — R0602 Shortness of breath: Secondary | ICD-10-CM | POA: Diagnosis not present

## 2020-04-05 DIAGNOSIS — I10 Essential (primary) hypertension: Secondary | ICD-10-CM

## 2020-04-05 DIAGNOSIS — E785 Hyperlipidemia, unspecified: Secondary | ICD-10-CM | POA: Insufficient documentation

## 2020-04-05 DIAGNOSIS — E782 Mixed hyperlipidemia: Secondary | ICD-10-CM

## 2020-04-05 MED ORDER — AMLODIPINE BESYLATE 5 MG PO TABS: 5.0000 mg | ORAL_TABLET | Freq: Every day | ORAL | 3 refills | Status: AC

## 2020-04-05 NOTE — Patient Instructions (Signed)
Medication Instructions:  Your physician has recommended you make the following change in your medication:  1) INCREASE amlodipine to 5 mg daily *If you need a refill on your cardiac medications before your next appointment, please call your pharmacy*  Follow-Up: At [facility], you and your health needs are our priority.  As part of our continuing mission to provide you with exceptional heart care, we have created designated Provider Care Teams.  These Care Teams include your primary Cardiologist (physician) and Advanced Practice Providers (APPs -  Physician Assistants and Nurse Practitioners) who all work together to provide you with the care you need, when you need it.  We recommend signing up for the patient portal called "MyChart".  Sign up information is provided on this After Visit Summary.  MyChart is used to connect with patients for Virtual Visits (Telemedicine).  Patients are able to view lab/test results, encounter notes, upcoming appointments, etc.  Non-urgent messages can be sent to your provider as well.   To learn more about what you can do with MyChart, go to [name].    Your next appointment:   1 year(s)  The format for your next appointment:   In Person  Provider:   You may see Johnny Moores, MD or one of the following Advanced Practice Providers on your designated Care Team:    Johnny Dopp, PA-C  Johnny Brown, [place]   Other Instructions  DASH Eating Plan DASH stands for "Dietary Approaches to Stop Hypertension." The DASH eating plan is a healthy eating plan that has been shown to reduce high blood pressure (hypertension). It may also reduce your risk for type 2 diabetes, heart disease, and stroke. The DASH eating plan may also help with weight loss. What are tips for following this plan?  General guidelines  Avoid eating more than 2,300 mg (milligrams) of salt (sodium) a day. If you have hypertension, you may need to reduce your sodium intake to  1,500 mg a day.  Limit alcohol intake to no more than 1 drink a day for nonpregnant women and 2 drinks a day for men. One drink equals 12 oz of beer, 5 oz of wine, or 1 oz of hard liquor.  Work with your health care provider to maintain a healthy body weight or to lose weight. Ask what an ideal weight is for you.  Get at least 30 minutes of exercise that causes your heart to beat faster (aerobic exercise) most days of the week. Activities may include walking, swimming, or biking.  Work with your health care provider or diet and nutrition specialist (dietitian) to adjust your eating plan to your individual calorie needs. Reading food labels   Check food labels for the amount of sodium per serving. Choose foods with less than 5 percent of the Daily Value of sodium. Generally, foods with less than 300 mg of sodium per serving fit into this eating plan.  To find whole grains, look for the word "whole" as the first word in the ingredient list. Shopping  Buy products labeled as "low-sodium" or "no salt added."  Buy fresh foods. Avoid canned foods and premade or frozen meals. Cooking  Avoid adding salt when cooking. Use salt-free seasonings or herbs instead of table salt or sea salt. Check with your health care provider or pharmacist before using salt substitutes.  Do not fry foods. Cook foods using healthy methods such as baking, boiling, grilling, and broiling instead.  Cook with heart-healthy oils, such as olive, canola, soybean, or sunflower  oil. Meal planning  Eat a balanced diet that includes: ? 5 or more servings of fruits and vegetables each day. At each meal, try to fill half of your plate with fruits and vegetables. ? Up to 6-8 servings of whole grains each day. ? Less than 6 oz of lean meat, poultry, or fish each day. A 3-oz serving of meat is about the same size as a deck of cards. One egg equals 1 oz. ? 2 servings of low-fat dairy each day. ? A serving of nuts, seeds, or  beans 5 times each week. ? Heart-healthy fats. Healthy fats called Omega-3 fatty acids are found in foods such as flaxseeds and coldwater fish, like sardines, salmon, and mackerel.  Limit how much you eat of the following: ? Canned or prepackaged foods. ? Food that is high in trans fat, such as fried foods. ? Food that is high in saturated fat, such as fatty meat. ? Sweets, desserts, sugary drinks, and other foods with added sugar. ? Full-fat dairy products.  Do not salt foods before eating.  Try to eat at least 2 vegetarian meals each week.  Eat more home-cooked food and less restaurant, buffet, and fast food.  When eating at a restaurant, ask that your food be prepared with less salt or no salt, if possible. What foods are recommended? The items listed may not be a complete list. Talk with your dietitian about what dietary choices are best for you. Grains Whole-grain or whole-wheat bread. Whole-grain or whole-wheat pasta. Brown rice. [name]. Bulgur. Whole-grain and low-sodium cereals. Pita bread. Low-fat, low-sodium crackers. Whole-wheat flour tortillas. Vegetables Fresh or frozen vegetables (raw, steamed, roasted, or grilled). Low-sodium or reduced-sodium tomato and vegetable juice. Low-sodium or reduced-sodium tomato sauce and tomato paste. Low-sodium or reduced-sodium canned vegetables. Fruits All fresh, dried, or frozen fruit. Canned fruit in natural juice (without added sugar). Meat and other protein foods Skinless chicken or [place]. Ground chicken or [place]. Pork with fat trimmed off. Fish and seafood. Egg whites. Dried beans, peas, or lentils. Unsalted nuts, nut butters, and seeds. Unsalted canned beans. Lean cuts of beef with fat trimmed off. Low-sodium, lean deli meat. Dairy Low-fat (1%) or fat-free (skim) milk. Fat-free, low-fat, or reduced-fat cheeses. Nonfat, low-sodium ricotta or cottage cheese. Low-fat or nonfat yogurt. Low-fat, low-sodium cheese. Fats and  oils Soft margarine without trans fats. Vegetable oil. Low-fat, reduced-fat, or light mayonnaise and salad dressings (reduced-sodium). Canola, safflower, olive, soybean, and sunflower oils. Avocado. Seasoning and other foods Herbs. Spices. Seasoning mixes without salt. Unsalted popcorn and pretzels. Fat-free sweets. What foods are not recommended? The items listed may not be a complete list. Talk with your dietitian about what dietary choices are best for you. Grains Baked goods made with fat, such as croissants, muffins, or some breads. Dry pasta or rice meal packs. Vegetables Creamed or fried vegetables. Vegetables in a cheese sauce. Regular canned vegetables (not low-sodium or reduced-sodium). Regular canned tomato sauce and paste (not low-sodium or reduced-sodium). Regular tomato and vegetable juice (not low-sodium or reduced-sodium). Angie Fava. Olives. Fruits Canned fruit in a light or heavy syrup. Fried fruit. Fruit in cream or butter sauce. Meat and other protein foods Fatty cuts of meat. Ribs. Fried meat. Berniece Salines. Sausage. Bologna and other processed lunch meats. Salami. Fatback. Hotdogs. Bratwurst. Salted nuts and seeds. Canned beans with added salt. Canned or smoked fish. Whole eggs or egg yolks. Chicken or [place] with skin. Dairy Whole or 2% milk, cream, and half-and-half. Whole or full-fat cream cheese.  Whole-fat or sweetened yogurt. Full-fat cheese. Nondairy creamers. Whipped toppings. Processed cheese and cheese spreads. Fats and oils Butter. Stick margarine. Lard. Shortening. Ghee. Bacon fat. Tropical oils, such as coconut, palm kernel, or palm oil. Seasoning and other foods Salted popcorn and pretzels. Onion salt, garlic salt, seasoned salt, table salt, and sea salt. Worcestershire sauce. Tartar sauce. Barbecue sauce. Teriyaki sauce. Soy sauce, including reduced-sodium. Steak sauce. Canned and packaged gravies. Fish sauce. Oyster sauce. Cocktail sauce. Horseradish that you find on the  shelf. Ketchup. Mustard. Meat flavorings and tenderizers. Bouillon cubes. Hot sauce and Tabasco sauce. Premade or packaged marinades. Premade or packaged taco seasonings. Relishes. Regular salad dressings. Where to find more information:  National Heart, Lung, and [place]: https://wilson-eaton.com/  American Heart Association: www.heart.org Summary  The DASH eating plan is a healthy eating plan that has been shown to reduce high blood pressure (hypertension). It may also reduce your risk for type 2 diabetes, heart disease, and stroke.  With the DASH eating plan, you should limit salt (sodium) intake to 2,300 mg a day. If you have hypertension, you may need to reduce your sodium intake to 1,500 mg a day.  When on the DASH eating plan, aim to eat more fresh fruits and vegetables, whole grains, lean proteins, low-fat dairy, and heart-healthy fats.  Work with your health care provider or diet and nutrition specialist (dietitian) to adjust your eating plan to your individual calorie needs. This information is not intended to replace advice given to you by your health care provider. Make sure you discuss any questions you have with your health care provider. Document Revised: 08/02/2017 Document Reviewed: 08/13/2016 Elsevier Patient Education  2020 [employer].

## 2020-05-17 ENCOUNTER — Other Ambulatory Visit: Payer: Self-pay

## 2020-05-17 ENCOUNTER — Encounter: Payer: Self-pay | Admitting: Pulmonary Disease

## 2020-05-17 ENCOUNTER — Ambulatory Visit: Payer: Medicare PPO | Admitting: Pulmonary Disease

## 2020-05-17 VITALS — BP 130/64 | HR 61 | Temp 97.8°F | Ht 69.0 in | Wt 229.0 lb

## 2020-05-17 DIAGNOSIS — R0602 Shortness of breath: Secondary | ICD-10-CM

## 2020-05-17 LAB — CBC WITH DIFFERENTIAL/PLATELET
Basophils Absolute: 0 10*3/uL (ref 0.0–0.1)
Basophils Relative: 0.6 % (ref 0.0–3.0)
Eosinophils Absolute: 0.6 10*3/uL (ref 0.0–0.7)
Eosinophils Relative: 9.2 % — ABNORMAL HIGH (ref 0.0–5.0)
HCT: 36.5 % — ABNORMAL LOW (ref 39.0–52.0)
Hemoglobin: 12.5 g/dL — ABNORMAL LOW (ref 13.0–17.0)
Lymphocytes Relative: 28.8 % (ref 12.0–46.0)
Lymphs Abs: 1.9 10*3/uL (ref 0.7–4.0)
MCHC: 34.2 g/dL (ref 30.0–36.0)
MCV: 86.6 fl (ref 78.0–100.0)
Monocytes Absolute: 0.7 10*3/uL (ref 0.1–1.0)
Monocytes Relative: 10.1 % (ref 3.0–12.0)
Neutro Abs: 3.4 10*3/uL (ref 1.4–7.7)
Neutrophils Relative %: 51.3 % (ref 43.0–77.0)
Platelets: 247 10*3/uL (ref 150.0–400.0)
RBC: 4.21 Mil/uL — ABNORMAL LOW (ref 4.22–5.81)
RDW: 13.8 % (ref 11.5–15.5)
WBC: 6.7 10*3/uL (ref 4.0–10.5)

## 2020-05-17 MED ORDER — ALBUTEROL SULFATE HFA 108 (90 BASE) MCG/ACT IN AERS: 2.0000 | INHALATION_SPRAY | Freq: Four times a day (QID) | RESPIRATORY_TRACT | 6 refills | Status: AC | PRN

## 2020-05-17 MED ORDER — TIOTROPIUM BROMIDE MONOHYDRATE 18 MCG IN CAPS: 18.0000 ug | ORAL_CAPSULE | Freq: Every day | RESPIRATORY_TRACT | 6 refills | Status: DC

## 2020-05-17 NOTE — Patient Instructions (Addendum)
Use spiriva 1 puff a day. I am hopeful this will improve your shortness of breath. Also, use albuterol AS NEEDED for shortness of breath. You could use this 30 minutes prior to any strenuous exertion to see if it helps.  I suspect your shortness of breath is due to the effect of tobacco smoke and/or possible asthma given your history of allergies.   We will check your blood counts today to make sure anemia is not contributing to your symptoms.  Come back in 3 months with Dr. Silas Flood with PFTs the same day.

## 2020-05-17 NOTE — Progress Notes (Signed)
Patient ID: Johnny Brown, male    DOB: April 10, 1946, 74 y.o.   MRN: [id number]  Chief Complaint  Patient presents with   Pulmonary Consult    Self referral. Pt c/o DOE for the past year. He states he gets SOB walking up an incline or fast pace on a flat surface.     Referring provider: Josem Kaufmann, MD  HPI:   Johnny Brown is a 74 y.o. man with PMH HTN and prior tobacco abuse whom we are seeing in consultation for evaluation of dyspnea on exertion.  Notes from referring provider and cardiologist reviewed.  First noted DOE about 1 year ago. Since then has been stable. DOE not much of an issue on flat surfaces or going down hills.  Occurs when lifting heavy objects.  Any incline, hill, or steps yield moderate to severe DOE. He has to rest and recovers his breath relatively quickly.  He has never used an inhaler.  Has not taken any medicine to see if it helps with the breathing.  In addition, he endorses onset of bilateral lower extremity pain from basically thighs to feet with significant exertion.  No fever or chills.  No orthopnea or PND.  He does have significant seasonal allergies and takes over-the-counter what sounds like antihistamines for symptomatic relief.  He has about 50-pack-year smoking history with cigarettes.  He smoked additional 10 years with a pipe.  He quit smoking about 1 year ago.  CT abdomen pelvis 05/2019 reviewed, can see the bases which demonstrate clear lungs, bilateral linear infiltrate independent basis favored represent atelectasis.  Chest x-ray obtained via PCP images viewed today with my interpretation as follows: Clear lungs, prominent bronchioles which could represent chronic bronchitis or asthmatic changes.  PMH: History of tobacco abuse in remission, seasonal allergies Surgical history: Rotator cuff repair, vasectomy Family history: Father CAD, lung cancer Social history: Lives in [place] with wife, retired, former smoker, 50+ pack year smoking  history       Questionaires / Pulmonary Flowsheets:   ACT:  No flowsheet data found.  MMRC: No flowsheet data found.  Epworth:  No flowsheet data found.  Tests:   FENO:  No results found for: NITRICOXIDE  PFT: No flowsheet data found.  WALK:  No flowsheet data found.  Imaging: No results found.  Lab Results:  CBC No results found for: WBC, RBC, HGB, HCT, PLT, MCV, MCH, MCHC, RDW, LYMPHSABS, MONOABS, EOSABS, BASOSABS  BMET    Component Value Date/Time   NA 138 05/05/2019 1136   NA 142 09/19/2017 0924   K 3.3 (L) 05/05/2019 1136   CL 102 05/05/2019 1136   CO2 27 05/05/2019 1136   GLUCOSE 87 05/05/2019 1136   BUN 20 05/05/2019 1136   BUN 19 09/19/2017 0924   CREATININE 1.13 05/05/2019 1136   CALCIUM 9.6 05/05/2019 1136   GFRNONAA 68 09/19/2017 0924   GFRAA 79 09/19/2017 0924    BNP No results found for: BNP  ProBNP No results found for: PROBNP  Specialty Problems      Pulmonary Problems   Dyspnea      Allergies  Allergen Reactions   Lisinopril     cough     Immunization History  Administered Date(s) Administered   Zoster Recombinat (Shingrix) 06/01/2018, 11/17/2018    Past Medical History:  Diagnosis Date   Allergy    Anxiety    Arthritis    Cataract    Diverticulosis    History of BPH  Hyperlipidemia    MIXED   Hyperplastic colonic polyp    Hypertension    UNSPE   Hypertension, benign    Hypertension, essential    Tobacco use disorder     Tobacco History: Social History   Tobacco Use  Smoking Status Former Smoker   Packs/day: 1.00   Years: 61.00   Pack years: 61.00   Types: Pipe, Cigarettes   Quit date: 02/02/2019   Years since quitting: 1.2  Smokeless Tobacco Never Used   Counseling given: Not Answered   Continue to not smoke  Outpatient Encounter Medications as of 05/17/2020  Medication Sig   amLODipine (NORVASC) 5 MG tablet Take 1 tablet (5 mg total) by mouth daily.   doxazosin  (CARDURA) 4 MG tablet Take 4 mg by mouth daily.   FLUoxetine (PROZAC) 20 MG capsule Take 20 mg by mouth daily.    loratadine (CLARITIN) 10 MG tablet Take 10 mg by mouth daily.   losartan-hydrochlorothiazide (HYZAAR) 100-25 MG tablet Take 1 tablet by mouth daily.    simvastatin (ZOCOR) 20 MG tablet Take 20 mg by mouth daily at 6 PM.    tiotropium (SPIRIVA) 18 MCG inhalation capsule Place 1 capsule (18 mcg total) into inhaler and inhale daily.   [DISCONTINUED] Cyanocobalamin (VITAMIN B-12 PO) Take 1 capsule by mouth daily.   No facility-administered encounter medications on file as of 05/17/2020.     Review of Systems  Review of Systems  No exertional chest pain.  No syncope or presyncope.  Comprehensive review of systems otherwise negative.  Physical Exam  BP 130/64 (BP Location: Left Arm, Cuff Size: Normal)    Pulse 61    Temp 97.8 F (36.6 C) (Temporal)    Ht 5\' 9"  (1.753 m)    Wt 229 lb (103.9 kg)    SpO2 100% Comment: on RA   BMI 33.82 kg/m   Wt Readings from Last 5 Encounters:  05/17/20 229 lb (103.9 kg)  04/05/20 228 lb (103.4 kg)  05/25/19 215 lb (97.5 kg)  05/05/19 215 lb 3.2 oz (97.6 kg)  01/20/18 211 lb 6.4 oz (95.9 kg)    BMI Readings from Last 5 Encounters:  05/17/20 33.82 kg/m  04/05/20 33.67 kg/m  05/25/19 31.75 kg/m  05/05/19 31.78 kg/m  01/20/18 31.22 kg/m     Physical Exam General: Sitting in chair, no acute distress Eyes: EOMI, no icterus Neck: No JVP appreciated sitting upright, supple Respiratory: Distant sounds, good excursion visually, no wheezing Cardiovascular: Regular rhythm, no murmurs Abdomen: Distended, soft, bowel sounds present MSK: No synovitis, joint effusion Neuro: Normal gait, no weakness Psych: Normal mood, full affect   Assessment & Plan:   Dyspnea on exertion: New over the last year or so.  Given exam with distant lung sounds and significant social history, suspect element of COPD, emphysema.  He does have seasonal  allergies which does raise some suspicion for possible asthma.  Lastly, he has a history of multiple colonic polyps status post resection via colonoscopy.  Will obtain CBC to rule out anemia as cause of his dyspnea.  Will obtain PFTs at next visit to assess for fixed obstruction and bronchodilator response.  For now, will start Spiriva 1 puff daily and albuterol as needed.   Return in about 3 months (around 08/16/2020).   Lanier Clam, MD 05/17/2020

## 2020-08-24 ENCOUNTER — Ambulatory Visit: Payer: Medicare PPO | Admitting: Pulmonary Disease

## 2020-08-24 ENCOUNTER — Encounter: Payer: Self-pay | Admitting: Pulmonary Disease

## 2020-08-24 ENCOUNTER — Other Ambulatory Visit: Payer: Self-pay

## 2020-08-24 ENCOUNTER — Ambulatory Visit (INDEPENDENT_AMBULATORY_CARE_PROVIDER_SITE_OTHER): Payer: Medicare PPO | Admitting: Pulmonary Disease

## 2020-08-24 VITALS — BP 128/76 | HR 77 | Temp 98.7°F | Ht 68.5 in | Wt 230.8 lb

## 2020-08-24 DIAGNOSIS — R0602 Shortness of breath: Secondary | ICD-10-CM | POA: Diagnosis not present

## 2020-08-24 LAB — PULMONARY FUNCTION TEST
DL/VA % pred: 82 %
DL/VA: 3.32 ml/min/mmHg/L
DLCO cor % pred: 76 %
DLCO cor: 18.53 ml/min/mmHg
DLCO unc % pred: 76 %
DLCO unc: 18.53 ml/min/mmHg
FEF 25-75 Post: 1.85 L/sec
FEF 25-75 Pre: 1.5 L/sec
FEF2575-%Change-Post: 23 %
FEF2575-%Pred-Post: 86 %
FEF2575-%Pred-Pre: 70 %
FEV1-%Change-Post: 6 %
FEV1-%Pred-Post: 81 %
FEV1-%Pred-Pre: 76 %
FEV1-Post: 2.36 L
FEV1-Pre: 2.22 L
FEV1FVC-%Change-Post: 4 %
FEV1FVC-%Pred-Pre: 99 %
FEV6-%Change-Post: 2 %
FEV6-%Pred-Post: 82 %
FEV6-%Pred-Pre: 80 %
FEV6-Post: 3.11 L
FEV6-Pre: 3.05 L
FEV6FVC-%Change-Post: 0 %
FEV6FVC-%Pred-Post: 106 %
FEV6FVC-%Pred-Pre: 105 %
FVC-%Change-Post: 1 %
FVC-%Pred-Post: 77 %
FVC-%Pred-Pre: 76 %
FVC-Post: 3.11 L
FVC-Pre: 3.07 L
Post FEV1/FVC ratio: 76 %
Post FEV6/FVC ratio: 100 %
Pre FEV1/FVC ratio: 73 %
Pre FEV6/FVC Ratio: 99 %
RV % pred: 127 %
RV: 3.13 L
TLC % pred: 92 %
TLC: 6.24 L

## 2020-08-24 MED ORDER — TIOTROPIUM BROMIDE-OLODATEROL 2.5-2.5 MCG/ACT IN AERS: 2.0000 | INHALATION_SPRAY | Freq: Every day | RESPIRATORY_TRACT | 0 refills | Status: DC

## 2020-08-24 NOTE — Progress Notes (Signed)
Patient ID: Johnny Brown, male    DOB: 1946/04/17, 74 y.o.   MRN: [id number]  Chief Complaint  Patient presents with  . Follow-up    F/U after PFT. States his breathing has been about the same since last visit.     Referring provider: Josem Kaufmann, MD  HPI:   DORN KARP is a 74 y.o. man with PMH HTN and prior tobacco abuse whom we are seeing in follow up for DOE.  Using Spiriva more PRN than regularly. Helps a little, not tremendously. Patient feels symptoms are mild. Only with inclines or stairs. Started exercise program. No weight loss yet but optimistic. PFTs today notable for gas trapping, severely reduced ERV indicative of obesity/chest wall interference, mildly reduced DLCO. Overall very reassuring. Mild changes from cigarette smoking most likely.   HPI at initial visit First noted DOE about 1 year ago. Since then has been stable. DOE not much of an issue on flat surfaces or going down hills.  Occurs when lifting heavy objects.  Any incline, hill, or steps yield moderate to severe DOE. He has to rest and recovers his breath relatively quickly.  He has never used an inhaler.  Has not taken any medicine to see if it helps with the breathing.  In addition, he endorses onset of bilateral lower extremity pain from basically thighs to feet with significant exertion.  No fever or chills.  No orthopnea or PND.  He does have significant seasonal allergies and takes over-the-counter what sounds like antihistamines for symptomatic relief.  He has about 50-pack-year smoking history with cigarettes.  He smoked additional 10 years with a pipe.  He quit smoking about 1 year ago.  CT abdomen pelvis 05/2019 reviewed, can see the bases which demonstrate clear lungs, bilateral linear infiltrate independent basis favored represent atelectasis.  Chest x-ray obtained via PCP images viewed today with my interpretation as follows: Clear lungs, prominent bronchioles which could represent chronic  bronchitis or asthmatic changes.  PMH: History of tobacco abuse in remission, seasonal allergies Surgical history: Rotator cuff repair, vasectomy Family history: Father CAD, lung cancer Social history: Lives in [place] with wife, retired, former smoker, 50+ pack year smoking history   Questionaires / Pulmonary Flowsheets:   ACT:  No flowsheet data found.  MMRC: No flowsheet data found.  Epworth:  No flowsheet data found.  Tests:   FENO:  No results found for: NITRICOXIDE  PFT: PFT Results Latest Ref Rng & Units 08/24/2020  FVC-Pre L 3.07  FVC-Predicted Pre % 76  FVC-Post L 3.11  FVC-Predicted Post % 77  Pre FEV1/FVC % % 73  Post FEV1/FCV % % 76  FEV1-Pre L 2.22  FEV1-Predicted Pre % 76  FEV1-Post L 2.36  DLCO uncorrected ml/min/mmHg 18.53  DLCO UNC% % 76  DLCO corrected ml/min/mmHg 18.53  DLCO COR %Predicted % 76  DLVA Predicted % 82  TLC L 6.24  TLC % Predicted % 92  RV % Predicted % 127  Personally reviewed and interpreted as: No fixed obstruction; FVC reduced indicative of mild restriction vs gas trapping; no bronchodilator response; TLC WNL; RV:TLC > 120% indicative of gas trapping, DLCO mildly reduced  WALK:  No flowsheet data found.  Imaging: Reviewed - bases of lungs clear with bilateral linear atelectasis 05/2019 on CT abdomen on my imterpretation  Lab Results: Personally reviewed - of note Eos 600 05/2020 CBC    Component Value Date/Time   WBC 6.7 05/17/2020 1159   RBC 4.21 (L) 05/17/2020  1159   HGB 12.5 (L) 05/17/2020 1159   HCT 36.5 (L) 05/17/2020 1159   PLT 247.0 05/17/2020 1159   MCV 86.6 05/17/2020 1159   MCHC 34.2 05/17/2020 1159   RDW 13.8 05/17/2020 1159   LYMPHSABS 1.9 05/17/2020 1159   MONOABS 0.7 05/17/2020 1159   EOSABS 0.6 05/17/2020 1159   BASOSABS 0.0 05/17/2020 1159    BMET    Component Value Date/Time   NA 138 05/05/2019 1136   NA 142 09/19/2017 0924   K 3.3 (L) 05/05/2019 1136   CL 102 05/05/2019 1136   CO2 27  05/05/2019 1136   GLUCOSE 87 05/05/2019 1136   BUN 20 05/05/2019 1136   BUN 19 09/19/2017 0924   CREATININE 1.13 05/05/2019 1136   CALCIUM 9.6 05/05/2019 1136   GFRNONAA 68 09/19/2017 0924   GFRAA 79 09/19/2017 0924    BNP No results found for: BNP  ProBNP No results found for: PROBNP  Specialty Problems      Pulmonary Problems   Dyspnea      Allergies  Allergen Reactions  . Lisinopril     cough     Immunization History  Administered Date(s) Administered  . Zoster Recombinat (Shingrix) 06/01/2018, 11/17/2018    Past Medical History:  Diagnosis Date  . Allergy   . Anxiety   . Arthritis   . Cataract   . Diverticulosis   . History of BPH   . Hyperlipidemia    MIXED  . Hyperplastic colonic polyp   . Hypertension    UNSPE  . Hypertension, benign   . Hypertension, essential   . Tobacco use disorder     Tobacco History: Social History   Tobacco Use  Smoking Status Former Smoker  . Packs/day: 1.00  . Years: 61.00  . Pack years: 61.00  . Types: Pipe, Cigarettes  . Quit date: 02/02/2019  . Years since quitting: 1.5  Smokeless Tobacco Never Used   Counseling given: Not Answered   Continue to not smoke  Outpatient Encounter Medications as of 08/24/2020  Medication Sig  . albuterol (VENTOLIN HFA) 108 (90 Base) MCG/ACT inhaler Inhale 2 puffs into the lungs every 6 (six) hours as needed for wheezing or shortness of breath.  Marland Kitchen amLODipine (NORVASC) 5 MG tablet Take 1 tablet (5 mg total) by mouth daily.  Marland Kitchen doxazosin (CARDURA) 4 MG tablet Take 4 mg by mouth daily.  Marland Kitchen FLUoxetine (PROZAC) 20 MG capsule Take 20 mg by mouth daily.   Marland Kitchen loratadine (CLARITIN) 10 MG tablet Take 10 mg by mouth daily.  Marland Kitchen losartan-hydrochlorothiazide (HYZAAR) 100-25 MG tablet Take 1 tablet by mouth daily.   . simvastatin (ZOCOR) 20 MG tablet Take 20 mg by mouth daily at 6 PM.   . [DISCONTINUED] tiotropium (SPIRIVA) 18 MCG inhalation capsule Place 1 capsule (18 mcg total) into inhaler  and inhale daily.  . Tiotropium Bromide-Olodaterol 2.5-2.5 MCG/ACT AERS Inhale 2 puffs into the lungs daily.   No facility-administered encounter medications on file as of 08/24/2020.     Review of Systems  n/a  Physical Exam  BP 128/76   Pulse 77   Temp 98.7 F (37.1 C) (Temporal)   Ht 5' 8.5" (1.74 m)   Wt 230 lb 12.8 oz (104.7 kg)   SpO2 97% Comment: on RA  BMI 34.58 kg/m   Wt Readings from Last 5 Encounters:  08/24/20 230 lb 12.8 oz (104.7 kg)  05/17/20 229 lb (103.9 kg)  04/05/20 228 lb (103.4 kg)  05/25/19 215 lb (  97.5 kg)  05/05/19 215 lb 3.2 oz (97.6 kg)    BMI Readings from Last 5 Encounters:  08/24/20 34.58 kg/m  05/17/20 33.82 kg/m  04/05/20 33.67 kg/m  05/25/19 31.75 kg/m  05/05/19 31.78 kg/m     Physical Exam General: Sitting in chair, no acute distress Eyes: EOMI, no icterus Neck: No JVP appreciated sitting upright, supple Respiratory: Distant sounds, good excursion visually, no wheezing Cardiovascular: Regular rhythm, no murmurs MSK: No synovitis, joint effusion Neuro: Normal gait, no weakness Psych: Normal mood, full affect   Assessment & Plan:   Dyspnea on exertion: New over the last year or so. Mild, only with inclines, stairs. PFTs without fixed obstruction but with gas trapping on lung volumes. Likely related to prior tobacco smoke exposure. He does have seasonal allergies which does raise some suspicion for possible asthma. Trial Stiolto 2 puffs once daily for dual bronchodilator therapy given gas trapping. If no improvement ok to stop. Some contribution of deconditioning, weight. He was congratulated and encouraged to continue exercise program.   Return in about 6 months (around 02/22/2021).   Lanier Clam, MD 08/24/2020

## 2020-08-24 NOTE — Progress Notes (Signed)
PFT done today. 

## 2020-08-24 NOTE — Patient Instructions (Addendum)
Try Stiolto 2 puffs once a day. If breathing isn't better after 4 weeks, no need to continue.  If it helps, let me know and I will send in refills  I will see you back in 6 months or sooner as needed.

## 2020-10-11 ENCOUNTER — Other Ambulatory Visit: Payer: Self-pay

## 2020-10-11 ENCOUNTER — Ambulatory Visit (AMBULATORY_SURGERY_CENTER): Payer: Medicare PPO

## 2020-10-11 VITALS — Ht 68.5 in | Wt 228.0 lb

## 2020-10-11 DIAGNOSIS — R194 Change in bowel habit: Secondary | ICD-10-CM

## 2020-10-11 DIAGNOSIS — R1900 Intra-abdominal and pelvic swelling, mass and lump, unspecified site: Secondary | ICD-10-CM

## 2020-10-11 MED ORDER — NA SULFATE-K SULFATE-MG SULF 17.5-3.13-1.6 GM/177ML PO SOLN: 1.0000 | Freq: Once | ORAL | 0 refills | Status: AC

## 2020-10-11 NOTE — Progress Notes (Signed)
No egg or soy allergy known to patient  No issues with past sedation with any surgeries or procedures No intubation problems in the past  No FH of Malignant Hyperthermia No diet pills per patient No home 02 use per patient  No blood thinners per patient  Pt denies issues with constipation  No A fib or A flutter  COVID 19 guidelines implemented in PV today with Pt and RN  Pt is fully vaccinated for Covid x 2= Code to Pharmacy and  NO PA's for preps discussed with pt in PV today  Due to the COVID-19 pandemic we are asking patients to follow certain guidelines.  Pt aware of COVID protocols and [facility] guidelines

## 2020-10-25 ENCOUNTER — Encounter: Payer: Self-pay | Admitting: Internal Medicine

## 2020-10-25 ENCOUNTER — Ambulatory Visit (AMBULATORY_SURGERY_CENTER): Payer: Medicare PPO | Admitting: Internal Medicine

## 2020-10-25 ENCOUNTER — Other Ambulatory Visit: Payer: Self-pay

## 2020-10-25 VITALS — BP 125/65 | HR 69 | Temp 97.5°F | Resp 15 | Ht 68.5 in | Wt 228.0 lb

## 2020-10-25 DIAGNOSIS — D124 Benign neoplasm of descending colon: Secondary | ICD-10-CM

## 2020-10-25 DIAGNOSIS — D123 Benign neoplasm of transverse colon: Secondary | ICD-10-CM

## 2020-10-25 DIAGNOSIS — Z86010 Personal history of colonic polyps: Secondary | ICD-10-CM | POA: Diagnosis not present

## 2020-10-25 DIAGNOSIS — K635 Polyp of colon: Secondary | ICD-10-CM

## 2020-10-25 DIAGNOSIS — R194 Change in bowel habit: Secondary | ICD-10-CM

## 2020-10-25 MED ORDER — SODIUM CHLORIDE 0.9 % IV SOLN: 500.0000 mL | Freq: Once | INTRAVENOUS | Status: DC

## 2020-10-25 NOTE — Progress Notes (Signed)
To PACU< VSS. Report to [email or web address] 

## 2020-10-25 NOTE — Op Note (Signed)
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 The patient's tolerance of                            previous anesthesia was also reviewed. The risks                            and benefits of the procedure and the sedation                            options and risks were discussed with the patient.                            All questions were answered, and informed consent                            was obtained. Prior Anticoagulants: The patient has                            taken no previous anticoagulant or antiplatelet                            agents. ASA Grade Assessment: III - A patient with                            severe systemic disease. After reviewing the risks                            and benefits, the patient was deemed in  satisfactory condition to undergo the procedure.                           After obtaining informed consent, the colonoscope                            was passed under direct vision. Throughout the                            procedure, the patient's blood pressure, pulse, and                            oxygen saturations were monitored continuously. The                            Olympus CF-HQ190L  (Serial# [id number]) Colonoscope was                            introduced through the anus and advanced to the                            cecum, identified by appendiceal orifice and                            ileocecal valve. Scope In: 8:16:05 AM Scope Out: 8:37:01 AM Total Procedure Duration: 0 hours 20 minutes 56 seconds  Findings:                 The digital rectal exam was normal.                           A 4 mm polyp was found in the transverse colon. The                            polyp was sessile. The polyp was removed with a                            cold snare. Resection and retrieval were complete.                           A 5 mm polyp was found in the descending colon. The                            polyp was sessile. The polyp was removed with a                            cold snare. Resection and retrieval were complete.                           Multiple small and large-mouthed diverticula were                            found from ascending colon to sigmoid colon.  Internal hemorrhoids were found during                            retroflexion. The hemorrhoids were small. Complications:            No immediate complications. Estimated Blood Loss:     Estimated blood loss was minimal. Impression:               - One 4 mm polyp in the transverse colon, removed                            with a cold snare. Resected and retrieved.                           - One 5 mm polyp in the descending colon, removed                            with a cold snare. Resected and retrieved.                           - Diverticulosis from ascending colon to sigmoid                            colon.                           - Small internal hemorrhoids. Recommendation:           - Patient has a contact number available for                            emergencies. The signs and symptoms of potential                            delayed complications were discussed with the                             patient. Return to normal activities tomorrow.                            Written discharge instructions were provided to the                            patient.                           - Resume previous diet.                           - Continue present medications.                           - Await pathology results.                           - Repeat colonoscopy in 3 years for surveillance. Jerene Bears, MD 10/25/2020 8:45:24 AM This report has  been signed electronically.

## 2020-10-25 NOTE — Progress Notes (Signed)
Called to room to assist during endoscopic procedure.  Patient ID and intended procedure confirmed with present staff. Received instructions for my participation in the procedure from the performing physician.  

## 2020-10-25 NOTE — Discharge Instructions (Signed)
Resume previous medications. Handouts on findings given to patient. Await pathology for final recommendations.  YOU HAD AN ENDOSCOPIC PROCEDURE TODAY AT THE [facility]:   Refer to the procedure report that was given to you for any specific questions about what was found during the examination.  If the procedure report does not answer your questions, please call your gastroenterologist to clarify.  If you requested that your care partner not be given the details of your procedure findings, then the procedure report has been included in a sealed envelope for you to review at your convenience later.  YOU SHOULD EXPECT: Some feelings of bloating in the abdomen. Passage of more gas than usual.  Walking can help get rid of the air that was put into your GI tract during the procedure and reduce the bloating. If you had a lower endoscopy (such as a colonoscopy or flexible sigmoidoscopy) you may notice spotting of blood in your stool or on the toilet paper. If you underwent a bowel prep for your procedure, you may not have a normal bowel movement for a few days.  Please Note:  You might notice some irritation and congestion in your nose or some drainage.  This is from the oxygen used during your procedure.  There is no need for concern and it should clear up in a day or so.  SYMPTOMS TO REPORT IMMEDIATELY:   Following lower endoscopy (colonoscopy or flexible sigmoidoscopy):  Excessive amounts of blood in the stool  Significant tenderness or worsening of abdominal pains  Swelling of the abdomen that is new, acute  Fever of 100F or higher  For urgent or emergent issues, a gastroenterologist can be reached at any hour by calling [phone]. Do not use MyChart messaging for urgent concerns.    DIET:  We do recommend a small meal at first, but then you may proceed to your regular diet.  Drink plenty of fluids but you should avoid alcoholic beverages for 24 hours.  ACTIVITY:  You should  plan to take it easy for the rest of today and you should NOT DRIVE or use heavy machinery until tomorrow (because of the sedation medicines used during the test).    FOLLOW UP: Our staff will call the number listed on your records 48-72 hours following your procedure to check on you and address any questions or concerns that you may have regarding the information given to you following your procedure. If we do not reach you, we will leave a message.  We will attempt to reach you two times.  During this call, we will ask if you have developed any symptoms of COVID 19. If you develop any symptoms (ie: fever, flu-like symptoms, shortness of breath, cough etc.) before then, please call [phone].  If you test positive for Covid 19 in the 2 weeks post procedure, please call and report this information to us.    If any biopsies were taken you will be contacted by phone or by letter within the next 1-3 weeks.  Please call us at [phone] if you have not heard about the biopsies in 3 weeks.    SIGNATURES/CONFIDENTIALITY: You and/or your care partner have signed paperwork which will be entered into your electronic medical record.  These signatures attest to the fact that that the information above on your After Visit Summary has been reviewed and is understood.  Full responsibility of the confidentiality of this discharge information lies with you and/or your care-partner. 

## 2020-10-25 NOTE — Progress Notes (Signed)
Pt's states no medical or surgical changes since previsit or office visit.   Vs by CW in adm 

## 2020-10-27 ENCOUNTER — Telehealth: Payer: Self-pay | Admitting: *Deleted

## 2020-10-27 ENCOUNTER — Telehealth: Payer: Self-pay

## 2020-10-27 NOTE — Telephone Encounter (Signed)
  Follow up Call-  Call back number 10/25/2020 05/25/2019  Post procedure Call Back phone  # [phone]  Permission to leave phone message Yes Yes  Some recent data might be hidden     Patient questions:  Do you have a fever, pain , or abdominal swelling? No. Pain Score  0 *  Have you tolerated food without any problems? Yes.    Have you been able to return to your normal activities? Yes.    Do you have any questions about your discharge instructions: Diet   No. Medications  No. Follow up visit  No.  Do you have questions or concerns about your Care? No.  Actions: * If pain score is 4 or above: No action needed, pain <4.   1. Have you developed a fever since your procedure? No   2.   Have you had an respiratory symptoms (SOB or cough) since your procedure? No   3.   Have you tested positive for COVID 19 since your procedure? No   4.   Have you had any family members/close contacts diagnosed with the COVID 19 since your procedure?  No    If yes to any of these questions please route to Joylene John, RN and Joella Prince, RN

## 2020-10-27 NOTE — Telephone Encounter (Signed)
First f/u call attempt.  Disconnected from voicemail, unable to leave a message.

## 2020-11-02 ENCOUNTER — Encounter: Payer: Self-pay | Admitting: Internal Medicine

## 2020-11-14 ENCOUNTER — Telehealth: Payer: Self-pay | Admitting: Pulmonary Disease

## 2020-11-14 DIAGNOSIS — R0602 Shortness of breath: Secondary | ICD-10-CM

## 2020-11-14 MED ORDER — TIOTROPIUM BROMIDE-OLODATEROL 2.5-2.5 MCG/ACT IN AERS: 2.0000 | INHALATION_SPRAY | Freq: Every day | RESPIRATORY_TRACT | 11 refills | Status: AC

## 2020-11-14 NOTE — Telephone Encounter (Signed)
Stiolto re-ordered with 11 refills.

## 2020-11-14 NOTE — Telephone Encounter (Signed)
I have called and spoke with pts wife and she is aware of the refills that have been sent in.  Nothing further is needed.

## 2020-11-14 NOTE — Telephone Encounter (Signed)
Call returned to patient wife, confirmed patient DOB. I made her aware at last visit he was given Stiolto to try. She reports the patient does not feel like it helps as much as the spiriva and would prefer to stay in spiriva. Confirmed pharmacy is [facility] in [place], [place].   MH please advise if ok to send in script for spiriva. Thanks :)

## 2020-11-23 ENCOUNTER — Encounter: Payer: Self-pay | Admitting: Physician Assistant

## 2020-11-23 ENCOUNTER — Ambulatory Visit (INDEPENDENT_AMBULATORY_CARE_PROVIDER_SITE_OTHER): Payer: Medicare PPO

## 2020-11-23 ENCOUNTER — Ambulatory Visit (INDEPENDENT_AMBULATORY_CARE_PROVIDER_SITE_OTHER): Payer: Medicare PPO | Admitting: Physician Assistant

## 2020-11-23 DIAGNOSIS — M25562 Pain in left knee: Secondary | ICD-10-CM

## 2020-11-23 DIAGNOSIS — M542 Cervicalgia: Secondary | ICD-10-CM

## 2020-11-23 DIAGNOSIS — M5442 Lumbago with sciatica, left side: Secondary | ICD-10-CM

## 2020-11-23 DIAGNOSIS — M25551 Pain in right hip: Secondary | ICD-10-CM | POA: Diagnosis not present

## 2020-11-23 DIAGNOSIS — M25552 Pain in left hip: Secondary | ICD-10-CM

## 2020-11-23 DIAGNOSIS — G8929 Other chronic pain: Secondary | ICD-10-CM

## 2020-11-23 DIAGNOSIS — M5441 Lumbago with sciatica, right side: Secondary | ICD-10-CM

## 2020-11-23 MED ORDER — GABAPENTIN 300 MG PO CAPS: 300.0000 mg | ORAL_CAPSULE | Freq: Three times a day (TID) | ORAL | 3 refills | Status: AC

## 2020-11-23 NOTE — Progress Notes (Signed)
Office Visit Note   Patient: Johnny Brown           Date of Birth: [birth year]           MRN: [id number] Visit Date: 11/23/2020              Requested by: Josem Kaufmann, MD [address] [place] [address] PCP: Josem Kaufmann, MD  Chief Complaint  Patient presents with  . Left Knee - Pain  . Right Hip - Pain  . Left Hip - Pain  . Neck - Pain  . Lower Back - Pain      HPI: Patient is a pleasant 74 year old gentleman who presents for several issues.  His #1 issue is lower back pain and radicular symptoms that have been going on for several years.  He denies any injury but he was a long-distance truck driver for many years.  Apparently he has had MRIs back in 2017 and was told he had bulging disks.  He did undergo epidural steroid injections which she found minimally useful.  He now has pain in the lower back that shoots down his legs to his feet.  He finds it difficult to sleep on either side.  As he has pain on the lateral side of his hip that shoots down to his feet.  He is also recently started getting burning in his feet.  He also feels he has difficulty with lifting his feet when he is walking uphill. Secondly he is complaining of neck pain.  This has been going on for a shorter period of time.  He does not have any radicular symptoms but does have an area of point tenderness on his spine.  He denies any injuries  Secondarily he does complain of some left knee pain and giving way.  He denies any injuries. He is currently taking meloxicam to help with all of this.  Assessment & Plan: Visit Diagnoses:  1. Neck pain   2. Chronic pain of left knee   3. Chronic bilateral low back pain with bilateral sciatica   4. Bilateral hip pain     Plan: Patient has multiple complaints.  Given his exam and his previous history I recommend an MRI of his cervical spine and lumbar spine with follow-up with Dr. Lorin Mercy.  In the meantime we discussed taking gabapentin for some of the burning in  his feet as he has taken this before and thought it did help him a little bit.  We will call this in.  I explained to him that certainly the most likely cause of this is issues with his spine.  Cannot completely rule out other neurological concerns.  But I think starting with the spine would be appropriate  Follow-Up Instructions: No follow-ups on file.   Ortho Exam  Patient is alert, oriented, no adenopathy, well-dressed, normal affect, normal respiratory effort. Focused examination of the cervical spine he does have some tenderness in the C5-6 area.  No paresthesias good strength with abduction abduction wrist flexion and extension.  Good grip strength.  Extension and flexion of his neck does not seem to increase his symptoms Examination of his lower spine no specific tenderness but with palpation that approximately L5 it does send shooting pain down the left side more than the right.  He has a bilateral positive straight leg raise worse on the left than the right.  Good strength 5 out of 5 with dorsiflexion and plantarflexion against resistance.  Does have  an antalgic gait secondary to discomfort.  When he rises out of a chair he takes him a while because he says it just takes that much time  Imaging: No results found. No images are attached to the encounter.  Labs: No results found for: HGBA1C, ESRSEDRATE, CRP, LABURIC, REPTSTATUS, GRAMSTAIN, CULT, LABORGA   No results found for: ALBUMIN, PREALBUMIN, CBC  No results found for: MG No results found for: VD25OH  No results found for: PREALBUMIN CBC EXTENDED Latest Ref Rng & Units 05/17/2020  WBC 4.0 - 10.5 K/uL 6.7  RBC 4.22 - 5.81 Mil/uL 4.21(L)  HGB 13.0 - 17.0 g/dL 12.5(L)  HCT 39.0 - 52.0 % 36.5(L)  PLT 150.0 - 400.0 K/uL 247.0  NEUTROABS 1.4 - 7.7 K/uL 3.4  LYMPHSABS 0.7 - 4.0 K/uL 1.9     There is no height or weight on file to calculate BMI.  Orders:  Orders Placed This Encounter  Procedures  . XR Knee 1-2 Views Left   . XR Lumbar Spine 2-3 Views  . XR Cervical Spine 2 or 3 views  . XR Pelvis 1-2 Views  . MR Cervical Spine w/o contrast  . MR Lumbar Spine w/o contrast   No orders of the defined types were placed in this encounter.    Procedures: No procedures performed  Clinical Data: No additional findings.  ROS:  All other systems negative, except as noted in the HPI. Review of Systems  Objective: Vital Signs: There were no vitals taken for this visit.  Specialty Comments:  No specialty comments available.  PMFS History: Patient Active Problem List   Diagnosis Date Noted  . HTN (hypertension) 04/05/2020  . HLD (hyperlipidemia) 04/05/2020  . Change in bowel habit 05/05/2019  . Abdominal swelling 05/05/2019  . Abdominal bloating 05/05/2019  . Other constipation 05/05/2019  . Dyspnea 03/22/2011  . Pipe smoker 03/22/2011  . Foot pain 03/22/2011   Past Medical History:  Diagnosis Date  . Allergy    seasonal allergies  . Anxiety    on meds  . Arthritis    back  . Cataract    bilateral   . Depression    on meds  . Diverticulosis   . Emphysema of lung ([place])    uses inhaler  . History of BPH   . Hyperlipidemia    MIXED--on meds  . Hyperplastic colonic polyp   . Hypertension    UNSPE  . Hypertension, benign    on meds  . Hypertension, essential   . Tobacco use disorder     Family History  Problem Relation Age of Onset  . Lung cancer Father   . Heart attack Father   . Liver cancer Brother   . Colon cancer Paternal Uncle   . Colon polyps Son   . Esophageal cancer Neg Hx   . Rectal cancer Neg Hx   . Stomach cancer Neg Hx     Past Surgical History:  Procedure Laterality Date  . CATARACT EXTRACTION, BILATERAL    . COLONOSCOPY  2020   JMP-MAC-good prep=TICS/hems/multiple TA's  . POLYPECTOMY  2020   mulitple  . ROTATOR CUFF REPAIR Right 2003  . VASECTOMY     Social History   Occupational History  . Not on file  Tobacco Use  . Smoking status: Former Smoker     Packs/day: 1.00    Years: 61.00    Pack years: 61.00    Types: Pipe, Cigarettes    Quit date: 02/02/2019    Years since  quitting: 1.8  . Smokeless tobacco: Never Used  Vaping Use  . Vaping Use: Never used  Substance and Sexual Activity  . Alcohol use: No  . Drug use: Never  . Sexual activity: Not on file

## 2020-11-29 ENCOUNTER — Ambulatory Visit: Payer: Medicare PPO | Admitting: Orthopaedic Surgery

## 2020-12-01 ENCOUNTER — Ambulatory Visit
Admission: RE | Admit: 2020-12-01 | Discharge: 2020-12-01 | Disposition: A | Discharge status: Home / Self Care | Payer: Medicare PPO | Source: Ambulatory Visit | Attending: Physician Assistant | Admitting: Physician Assistant

## 2020-12-01 ENCOUNTER — Other Ambulatory Visit: Payer: Self-pay

## 2020-12-01 DIAGNOSIS — G8929 Other chronic pain: Secondary | ICD-10-CM

## 2020-12-01 DIAGNOSIS — M542 Cervicalgia: Secondary | ICD-10-CM

## 2020-12-01 DIAGNOSIS — M5442 Lumbago with sciatica, left side: Secondary | ICD-10-CM

## 2020-12-06 ENCOUNTER — Ambulatory Visit: Payer: Medicare PPO | Admitting: Orthopaedic Surgery

## 2020-12-06 ENCOUNTER — Encounter: Payer: Self-pay | Admitting: Orthopaedic Surgery

## 2020-12-06 DIAGNOSIS — M4802 Spinal stenosis, cervical region: Secondary | ICD-10-CM | POA: Diagnosis not present

## 2020-12-06 NOTE — Progress Notes (Signed)
Office Visit Note   Patient: Johnny Brown           Date of Birth: Apr 14, 1946           MRN: [id number] Visit Date: 12/06/2020              Requested by: Josem Kaufmann, MD [address] [place] [address] PCP: Josem Kaufmann, MD   Assessment & Plan: Visit Diagnoses:  1. Spinal stenosis of cervical region   2. 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  Significant spondylosis and moderate to severe foraminal stenosis see 5-6.  Plan would be two-level cervical fusion with allograft and plate overnight stay in the hospital.  We discussed operative technique risks of dysphagia, dysphonia, pseudoarthrosis, reoperation.  Potential for progression of myelopathy without surgery discussed.  He understands that surgery is to help prevent progression.  Potential for progressive spondylosis at other levels.  MRI scan was reviewed with patient and his wife who is present.  Indications for surgery discussed he understands and requests we proceed.  Decision for surgery made today due to his cervical stenosis and myelopathy changes on his cord.  Follow-Up Instructions: No follow-ups on file.   Orders:  No orders of the defined types were placed in this encounter.  No orders of the defined types were placed in this encounter.     Procedures: No procedures performed   Clinical Data: No additional findings.   Subjective: Chief Complaint  Patient presents with  . Neck - Pain    MRI cervical spine review  . 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  Patient's had MRI scan of the lumbar spine and cervical spine.  Lumbar spine shows some disc protrusion with mild central narrowing at L4-5 and moderate to severe right and moderate left  neural foraminal narrowing.  Patient states pain over his trochanter is also problems with left knee pain.  Knee x-rays showed no significant knee arthritis.  He has had some groin pain and noted limitation of hip internal and external rotation.  Some numbness in his hands.  He has to be careful where he walks due to balance and his wife states sometimes he staggers as if he is intoxicated.  This is progressed over the last 6 months.  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  At C5-6 adjacent level he has severe left and moderate right neuroforaminal narrowing and mild central stenosis.  Patient is taken anti-inflammatories.  He is sleeping little bit better with Neurontin but no change in neck pain upper extremity weakness or gait problems.  Patient's gait is improved leaning on a grocery cart.  He can only make it three fourths of a mile on the treadmill.  Last lumbar epidural was 2017 with good relief for a week or so.  Patient states years ago there is 2 of times that he fell out of a truck and one of them he hurt his neck fairly bad and had significant neck pain associated with it.  Review of Systems 14 point review of system positive hypertension hyperlipidemia.   Objective: Vital Signs: BP Marland Kitchen)  147/71   Pulse 65   Ht _0  (1.753 m)   Wt 226 lb (102.5 kg)   BMI 33.37 kg/m   Physical Exam Constitutional:      Appearance: He is well-developed.  HENT:     Head: Normocephalic and atraumatic.  Eyes:     Pupils: Pupils are equal, round, and reactive to light.  Neck:     Thyroid: No thyromegaly.     Trachea: No tracheal deviation.  Cardiovascular:     Rate and Rhythm: Normal rate.  Pulmonary:     Effort: Pulmonary effort is normal.     Breath sounds: No wheezing.  Abdominal:     General: Bowel sounds are normal.     Palpations: Abdomen is soft.  Skin:     General: Skin is warm and dry.     Capillary Refill: Capillary refill takes less than 2 seconds.  Neurological:     Mental Status: He is alert and oriented to person, place, and time.  Psychiatric:        Behavior: Behavior normal.        Thought Content: Thought content normal.        Judgment: Judgment normal.     Ortho Exam patient has intact upper extremity reflexes.  Positive Lhermitte, moderately positive Spurling right and left.  Biceps deltoid triceps wrist flexion extension interossei are strong.  Slight decreased grip strength right and left.  Patient has slow deliberate gait slightly wide-based.  Trochanteric bursal tenderness.  Limitation of hip internal rotation only 10 to 20 degrees on the left with some groin pain.  Opposite hip has 20 degrees internal/external rotation without significant pain.  No knee effusion full extension flexion mild crepitus left knee with extension.  Distal pulses are palpable.  Specialty Comments:  No specialty comments available.  Imaging:CLINICAL DATA:  Chronic degenerative neck pain  EXAM: MRI CERVICAL SPINE WITHOUT CONTRAST  TECHNIQUE: Multiplanar, multisequence MR imaging of the cervical spine was performed. No intravenous contrast was administered.  COMPARISON:  None.  FINDINGS: Alignment: There is a mild leftward curvature of the cervical spine.  Vertebrae: The vertebral body heights are well maintained. No fracture, marrow edema,or pathologic marrow infiltration.  Cord: There appears to be question of mildly increased signal seen within the cord at C4-C5 without cord expansion.  Posterior Fossa, vertebral arteries, paraspinal tissues:  The visualized portion of the posterior fossa is unremarkable. Normal flow voids seen within the vertebral arteries. The paraspinal soft tissues are unremarkable.  Disc levels:  C1-C2: Atlanto-axial junction is normal, without canal narrowing  C2-C3: Disc osteophyte complex and  uncovertebral osteophytes are seen which causes mild bilateral neural foraminal narrowing.  C3-C4: Disc osteophyte complex and uncovertebral osteophytes are seen which causes moderate bilateral neural foraminal narrowing and central canal stenosis.  C4-C5: There is a disc osteophyte complex and uncovertebral osteophytes which causes severe right and moderate left neural foraminal narrowing and moderate central canal stenosis.  C5-C6: There is a disc osteophyte complex which is asymmetric to the right with uncovertebral osteophytes. Severe left and moderate to severe right neural foraminal narrowing and mild central canal stenosis is seen.  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  Cervical spine spondylosis most notable at C4-C5 with severe right and moderate left neural foraminal narrowing and moderate central canal stenosis.   Electronically  Signed   By: Prudencio Pair M.D.   On: 12/01/2020 16:50     PMFS History: Patient Active Problem List   Diagnosis Date Noted  . Spinal stenosis of cervical region 12/06/2020  . 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 HTN (hypertension) 04/05/2020  . HLD (hyperlipidemia) 04/05/2020  . Change in bowel habit 05/05/2019  . Abdominal swelling 05/05/2019  . Abdominal bloating 05/05/2019  . Other constipation 05/05/2019  . Dyspnea 03/22/2011  . Pipe smoker 03/22/2011  . Foot pain 03/22/2011   Past Medical History:  Diagnosis Date  . Allergy    seasonal allergies  . Anxiety    on meds  . Arthritis    back  . Cataract    bilateral   . Depression    on meds  . Diverticulosis   . Emphysema of lung ([place])    uses inhaler  . History of BPH   . Hyperlipidemia    MIXED--on meds  .  Hyperplastic colonic polyp   . Hypertension    UNSPE  . Hypertension, benign    on meds  . Hypertension, essential   . Tobacco use disorder     Family History  Problem Relation Age of Onset  . Lung cancer Father   . Heart attack Father   . Liver cancer Brother   . Colon cancer Paternal Uncle   . Colon polyps Son   . Esophageal cancer Neg Hx   . Rectal cancer Neg Hx   . Stomach cancer Neg Hx     Past Surgical History:  Procedure Laterality Date  . CATARACT EXTRACTION, BILATERAL    . COLONOSCOPY  2020   JMP-MAC-good prep=TICS/hems/multiple TA's  . POLYPECTOMY  2020   mulitple  . ROTATOR CUFF REPAIR Right 2003  . VASECTOMY     Social History   Occupational History  . Not on file  Tobacco Use  . Smoking status: Former Smoker    Packs/day: 1.00    Years: 61.00    Pack years: 61.00    Types: Pipe, Cigarettes    Quit date: 02/02/2019    Years since quitting: 1.8  . Smokeless tobacco: Never Used  Vaping Use  . Vaping Use: Never used  Substance and Sexual Activity  . Alcohol use: No  . Drug use: Never  . Sexual activity: Not on file

## 2020-12-15 ENCOUNTER — Other Ambulatory Visit: Payer: Self-pay

## 2020-12-26 NOTE — Pre-Procedure Instructions (Signed)
Surgical Instructions    Your procedure is scheduled on Friday, April 29th.  Report to [facility] Main Entrance "A" at 5:45 A.M., then check in with the Admitting office.  Call this number if you have problems the morning of surgery:  [phone]   If you have any questions prior to your surgery date call [phone]: Open Monday-Friday 8am-4pm    Remember:  Do not eat after midnight the night before your surgery  You may drink clear liquids until 4:45 A.M. the morning of your surgery.   Clear liquids allowed are: Water, Non-Citrus Juices (without pulp), Carbonated Beverages, Clear Tea, Black Coffee Only, and Gatorade.   Enhanced Recovery after Surgery for Orthopedics Enhanced Recovery after Surgery is a protocol used to improve the stress on your body and your recovery after surgery.  Patient Instructions  . The night before surgery:  o No food after midnight. ONLY clear liquids after midnight   . The day of surgery (if you do NOT have diabetes):  o Drink ONE (1) Pre-Surgery Clear Ensure by 4:45 am the morning of surgery   o This drink was given to you during your hospital  pre-op appointment visit. o Nothing else to drink after completing the  Pre-Surgery Clear Ensure.         If you have questions, please contact your surgeon's office.     Take these medicines the morning of surgery with A SIP OF WATER   amLODipine (NORVASC) buPROPion (WELLBUTRIN XL)  cetirizine (ZYRTEC)  doxazosin (CARDURA)  FLUoxetine (PROZAC) tiotropium (SPIRIVA) inhaler-please bring with you on the day of surgery. acetaminophen (TYLENOL)-use as needed for pain.  As of today, STOP taking any Aspirin (unless otherwise instructed by your surgeon) Aleve, Naproxen, Ibuprofen, Motrin, Advil, Goody's, BC's, all herbal medications, fish oil, and all vitamins. This includes: meloxicam (MOBIC).                     Do NOT Smoke (Tobacco/Vaping) or drink Alcohol 24 hours prior to your procedure.  If  you use a CPAP at night, you may bring all equipment for your overnight stay.   Contacts, glasses, piercing's, hearing aid's, dentures or partials may not be worn into surgery, please bring cases for these belongings.    For patients admitted to the hospital, discharge time will be determined by your treatment team.   Patients discharged the day of surgery will not be allowed to drive home, and someone needs to stay with them for 24 hours.    Special instructions:   [facility]- Preparing For Surgery  Before surgery, you can play an important role. Because skin is not sterile, your skin needs to be as free of germs as possible. You can reduce the number of germs on your skin by washing with CHG (chlorahexidine gluconate) Soap before surgery.  CHG is an antiseptic cleaner which kills germs and bonds with the skin to continue killing germs even after washing.    Oral Hygiene is also important to reduce your risk of infection.  Remember - BRUSH YOUR TEETH THE MORNING OF SURGERY WITH YOUR REGULAR TOOTHPASTE  Please do not use if you have an allergy to CHG or antibacterial soaps. If your skin becomes reddened/irritated stop using the CHG.  Do not shave (including legs and underarms) for at least 48 hours prior to first CHG shower. It is OK to shave your face.  Please follow these instructions carefully.   1. Shower the NIGHT BEFORE SURGERY and the  MORNING OF SURGERY  2. If you chose to wash your hair, wash your hair first as usual with your normal shampoo.  3. After you shampoo, rinse your hair and body thoroughly to remove the shampoo.  4. Use CHG Soap as you would any other liquid soap. You can apply CHG directly to the skin and wash gently with a scrungie or a clean washcloth.   5. Apply the CHG Soap to your body ONLY FROM THE NECK DOWN.  Do not use on open wounds or open sores. Avoid contact with your eyes, ears, mouth and genitals (private parts). Wash Face and genitals (private parts)   with your normal soap.   6. Wash thoroughly, paying special attention to the area where your surgery will be performed.  7. Thoroughly rinse your body with warm water from the neck down.  8. DO NOT shower/wash with your normal soap after using and rinsing off the CHG Soap.  9. Pat yourself dry with a CLEAN TOWEL.  10. Wear CLEAN PAJAMAS to bed the night before surgery  11. Place CLEAN SHEETS on your bed the night before your surgery  12. DO NOT SLEEP WITH PETS.   Day of Surgery: Shower with CHG soap. Do not wear jewelry. Do not wear lotions, powders, colognes, or deodorant. Men may shave face and neck. Do not bring valuables to the hospital. [facility] is not responsible for any belongings or valuables. Wear Clean/Comfortable clothing the morning of surgery Remember to brush your teeth WITH YOUR REGULAR TOOTHPASTE.   Please read over the following fact sheets that you were given.

## 2020-12-27 ENCOUNTER — Other Ambulatory Visit: Payer: Self-pay

## 2020-12-27 ENCOUNTER — Encounter (HOSPITAL_COMMUNITY): Payer: Self-pay

## 2020-12-27 ENCOUNTER — Encounter (HOSPITAL_COMMUNITY)
Admission: RE | Admit: 2020-12-27 | Discharge: 2020-12-27 | Disposition: A | Discharge status: Home / Self Care | Payer: Medicare PPO | Source: Ambulatory Visit | Attending: Orthopaedic Surgery | Admitting: Orthopaedic Surgery

## 2020-12-27 DIAGNOSIS — Z01812 Encounter for preprocedural laboratory examination: Secondary | ICD-10-CM | POA: Insufficient documentation

## 2020-12-27 DIAGNOSIS — Z20822 Contact with and (suspected) exposure to covid-19: Secondary | ICD-10-CM | POA: Insufficient documentation

## 2020-12-27 LAB — CBC
HCT: 36.7 % — ABNORMAL LOW (ref 39.0–52.0)
Hemoglobin: 12.2 g/dL — ABNORMAL LOW (ref 13.0–17.0)
MCH: 29.7 pg (ref 26.0–34.0)
MCHC: 33.2 g/dL (ref 30.0–36.0)
MCV: 89.3 fL (ref 80.0–100.0)
Platelets: 230 10*3/uL (ref 150–400)
RBC: 4.11 MIL/uL — ABNORMAL LOW (ref 4.22–5.81)
RDW: 12.9 % (ref 11.5–15.5)
WBC: 6 10*3/uL (ref 4.0–10.5)
nRBC: 0 % (ref 0.0–0.2)

## 2020-12-27 LAB — SURGICAL PCR SCREEN
MRSA, PCR: NEGATIVE
Staphylococcus aureus: NEGATIVE

## 2020-12-27 LAB — BASIC METABOLIC PANEL
Anion gap: 8 (ref 5–15)
BUN: 27 mg/dL — ABNORMAL HIGH (ref 8–23)
CO2: 25 mmol/L (ref 22–32)
Calcium: 9.3 mg/dL (ref 8.9–10.3)
Chloride: 101 mmol/L (ref 98–111)
Creatinine, Ser: 1.34 mg/dL — ABNORMAL HIGH (ref 0.61–1.24)
GFR, Estimated: 56 mL/min — ABNORMAL LOW (ref >60)
Glucose, Bld: 137 mg/dL — ABNORMAL HIGH (ref 70–99)
Potassium: 3.9 mmol/L (ref 3.5–5.1)
Sodium: 134 mmol/L — ABNORMAL LOW (ref 135–145)

## 2020-12-27 LAB — SARS CORONAVIRUS 2 (TAT 6-24 HRS): SARS Coronavirus 2: NEGATIVE

## 2020-12-27 NOTE — Pre-Procedure Instructions (Signed)
Surgical Instructions    Your procedure is scheduled on Friday, April 29th.  Report to [facility] Main Entrance "A" at 5:45 A.M., then check in with the Admitting office.  Call this number if you have problems the morning of surgery:  [phone]   If you have any questions prior to your surgery date call [phone]: Open Monday-Friday 8am-4pm    Remember:  Do not eat after midnight the night before your surgery  You may drink clear liquids until 4:45 A.M. the morning of your surgery.   Clear liquids allowed are: Water, Non-Citrus Juices (without pulp), Carbonated Beverages, Clear Tea, Black Coffee Only, and Gatorade.     Take these medicines the morning of surgery with A SIP OF WATER   amLODipine (NORVASC) buPROPion (WELLBUTRIN XL)  cetirizine (ZYRTEC)  doxazosin (CARDURA)  FLUoxetine (PROZAC) tiotropium (SPIRIVA) inhaler-please bring with you on the day of surgery. acetaminophen (TYLENOL)-use as needed for pain.  As of today, STOP taking any Aspirin (unless otherwise instructed by your surgeon) Aleve, Naproxen, Ibuprofen, Motrin, Advil, Goody's, BC's, all herbal medications, fish oil, and all vitamins. This includes: meloxicam (MOBIC).                     Do NOT Smoke (Tobacco/Vaping) or drink Alcohol 24 hours prior to your procedure.  If you use a CPAP at night, you may bring all equipment for your overnight stay.   Contacts, glasses, piercing's, hearing aid's, dentures or partials may not be worn into surgery, please bring cases for these belongings.    For patients admitted to the hospital, discharge time will be determined by your treatment team.   Patients discharged the day of surgery will not be allowed to drive home, and someone needs to stay with them for 24 hours.    Special instructions:   [facility]- Preparing For Surgery  Before surgery, you can play an important role. Because skin is not sterile, your skin needs to be as free of germs as possible. You  can reduce the number of germs on your skin by washing with CHG (chlorahexidine gluconate) Soap before surgery.  CHG is an antiseptic cleaner which kills germs and bonds with the skin to continue killing germs even after washing.    Oral Hygiene is also important to reduce your risk of infection.  Remember - BRUSH YOUR TEETH THE MORNING OF SURGERY WITH YOUR REGULAR TOOTHPASTE  Please do not use if you have an allergy to CHG or antibacterial soaps. If your skin becomes reddened/irritated stop using the CHG.  Do not shave (including legs and underarms) for at least 48 hours prior to first CHG shower. It is OK to shave your face.  Please follow these instructions carefully.   1. Shower the NIGHT BEFORE SURGERY and the MORNING OF SURGERY  2. If you chose to wash your hair, wash your hair first as usual with your normal shampoo.  3. After you shampoo, rinse your hair and body thoroughly to remove the shampoo.  4. Use CHG Soap as you would any other liquid soap. You can apply CHG directly to the skin and wash gently with a scrungie or a clean washcloth.   5. Apply the CHG Soap to your body ONLY FROM THE NECK DOWN.  Do not use on open wounds or open sores. Avoid contact with your eyes, ears, mouth and genitals (private parts). Wash Face and genitals (private parts)  with your normal soap.   6. Wash thoroughly, paying special  attention to the area where your surgery will be performed.  7. Thoroughly rinse your body with warm water from the neck down.  8. DO NOT shower/wash with your normal soap after using and rinsing off the CHG Soap.  9. Pat yourself dry with a CLEAN TOWEL.  10. Wear CLEAN PAJAMAS to bed the night before surgery  11. Place CLEAN SHEETS on your bed the night before your surgery  12. DO NOT SLEEP WITH PETS.   Day of Surgery: Shower with CHG soap. Do not wear jewelry. Do not wear lotions, powders, colognes, or deodorant. Men may shave face and neck. Do not bring  valuables to the hospital. [facility] is not responsible for any belongings or valuables. Wear Clean/Comfortable clothing the morning of surgery Remember to brush your teeth WITH YOUR REGULAR TOOTHPASTE.   Please read over the following fact sheets that you were given.

## 2020-12-27 NOTE — Progress Notes (Signed)
   12/27/20 1018  OBSTRUCTIVE SLEEP APNEA  Have you ever been diagnosed with sleep apnea through a sleep study? No  Do you snore loudly (loud enough to be heard through closed doors)?  0  Do you often feel tired, fatigued, or sleepy during the daytime (such as falling asleep during driving or talking to someone)? 1  Has anyone observed you stop breathing during your sleep? 0  Do you have, or are you being treated for high blood pressure? 1  BMI more than 35 kg/m2? 0  Age > 50 (1-yes) 1  Neck circumference greater than:Male 16 inches or larger, Male 17inches or larger? 1  Male Gender (Yes=1) 1  Obstructive Sleep Apnea Score 5  Score 5 or greater  Results sent to PCP

## 2020-12-27 NOTE — Progress Notes (Signed)
PCP - Dr. Pat Kocher @ [facility]  Chest x-ray - 12/13/20 EKG - 12/13/20 Stress Test - 09/27/17 ECHO - 09/27/17 Cardiac Cath - denies  Sleep Study - denies  COVID TEST- 12/27/20. Pending. Patient is aware to quarantine.    Anesthesia review: Yes. Clearance Received. Notes,EKG, Chest x-ray in shadow chart.   Patient denies shortness of breath, fever, cough and chest pain at PAT appointment   All instructions explained to the patient, with a verbal understanding of the material. Patient agrees to go over the instructions while at home for a better understanding. Patient also instructed to self quarantine after being tested for COVID-19. The opportunity to ask questions was provided.

## 2020-12-28 ENCOUNTER — Ambulatory Visit: Payer: Medicare PPO | Admitting: Surgery

## 2020-12-28 ENCOUNTER — Encounter: Payer: Self-pay | Admitting: Surgery

## 2020-12-28 VITALS — BP 150/69 | HR 60 | Ht 69.0 in | Wt 227.4 lb

## 2020-12-28 DIAGNOSIS — M4802 Spinal stenosis, cervical region: Secondary | ICD-10-CM

## 2020-12-28 NOTE — Progress Notes (Signed)
Anesthesia Chart Review:  Case: [id number] Date/Time: 12/30/20 0745   Procedure: ANTERIOR CERVICAL DISCECTOMY FUSION C4-5. C5-6, allograft, plate (N/A )   Anesthesia type: General   Pre-op diagnosis: C4-5, C5-6 spondylosis, stenosis   Location: [facility] OR ROOM 20 / [facility]   Surgeons: [name], MD      DISCUSSION: Patient is a 75 year old male scheduled for the above procedure.  History includes former smoker (quit 02/02/19), emphysema, HLD, HTN, BPH, anxiety. Colonoscopy 10/05/20 (diverticulosis ascending colon, s/p sessile serrated polyp resection x2). BMI is consistent with obesity. OSA screening score was 5.   Medical clearance provided by Dellia Nims, NP. Clearance later was accompanied by 12/13/20 EKG, CXR report, and labs. EKG showed SR, borderline RAD.   12/27/2020 presurgical COVID-19 test negative.  Anesthesia team to evaluate on the day of surgery.  Surgeon orders were not available at PAT visit, so if additional labs or imaging ordered these would need to be done on the day of surgery.    VS: BP 131/63   Pulse 64   Temp 36.4 C (Oral)   Resp 20   Ht _0  (1.753 m)   Wt 103.2 kg   SpO2 100%   BMI 33.59 kg/m    PROVIDERS: Josem Kaufmann, MD is PCP ([facility])  Acie Fredrickson, Arnette Norris, MD is cardiologist. Last visit 04/05/20 for follow-up dyspnea with reassuring cardiac work-up in 2019. He advised ongoing follow-up with PCP and consider evaluation by pulmonology for dyspnea.    LABS: Labs reviewed: Acceptable for surgery. Creatinine 1.34, BUN 27. Comparison labs from [facility] on 12/13/20 showed BUN Johnny, Creatinine 1.5, so renal function appears stable. (all labs ordered are listed, but only abnormal results are displayed)  Labs Reviewed  CBC - Abnormal; Notable for the following components:      Result Value   RBC 4.11 (*)    Hemoglobin 12.2 (*)    HCT 36.7 (*)    All other components within normal limits  BASIC METABOLIC PANEL -  Abnormal; Notable for the following components:   Sodium 134 (*)    Glucose, Bld 137 (*)    BUN 27 (*)    Creatinine, Ser 1.34 (*)    GFR, Estimated 56 (*)    All other components within normal limits  SURGICAL PCR SCREEN  SARS CORONAVIRUS 2 (TAT 6-Johnny HRS)   Spirometry 08/24/20: FVC 3.07 (76%), post 3.11 (77%). FEV1 2.22 (76%), post 2.36 (81%). DLCO unc/cor 18.52 (76%).   IMAGES: CXR 12/13/20  [facility]): Findings: The chest demonstrates the mediastinum is unremarkable.  The lungs are clear.  No pleural effusions or pneumothorax.  The bones are unremarkable. Impression: No acute cardiopulmonary process.  MRI L-spine 12/01/20: IMPRESSION: - Minimal retrolisthesis of L1 on L2. - Lumbar spine spondylosis most notable at L4-L5 with moderate bilateral neural foraminal narrowing and mild central canal stenosis also at L5-S1 with a right foraminal disc protrusion causing moderate to severe right neural foraminal narrowing.   EKG: 12/13/20 ([facility]): NSR. Borderline right axis deviation.   CV: Echo 09/27/17: Study Conclusions  - Left ventricle: The cavity size was normal. Wall thickness was  normal. Systolic function was normal. The estimated ejection  fraction was in the range of 60% to 65%. Doppler parameters are  consistent with abnormal left ventricular relaxation (grade 1  diastolic dysfunction).   Nuclear stress test 09/27/17:  Nuclear stress EF: 54%. The left ventricular ejection fraction  is mildly decreased (45-54%).  The study is normal. There is no evidence of ischemia and no evidence of previous infarction  This is a low risk study.   Past Medical History:  Diagnosis Date  . Allergy    seasonal allergies  . Anxiety    on meds  . Arthritis    back  . Cataract    bilateral   . Depression    on meds  . Diverticulosis   . Emphysema of lung ([place])    uses inhaler  . History of BPH   . Hyperlipidemia    MIXED--on meds   . Hyperplastic colonic polyp   . Hypertension    UNSPE  . Hypertension, benign    on meds  . Hypertension, essential   . Tobacco use disorder     Past Surgical History:  Procedure Laterality Date  . CATARACT EXTRACTION, BILATERAL    . COLONOSCOPY  2020   JMP-MAC-good prep=TICS/hems/multiple TA's  . COLONOSCOPY    . EYE SURGERY     Cataract Surgery Both Eyes  . POLYPECTOMY  2020   mulitple  . ROTATOR CUFF REPAIR Right 2003  . VASECTOMY      MEDICATIONS: . acetaminophen (TYLENOL) 650 MG CR tablet  . albuterol (VENTOLIN HFA) 108 (90 Base) MCG/ACT inhaler  . amLODipine (NORVASC) 5 MG tablet  . buPROPion (WELLBUTRIN XL) 150 MG Johnny hr tablet  . cetirizine (ZYRTEC) 10 MG tablet  . doxazosin (CARDURA) 4 MG tablet  . FLUoxetine (PROZAC) 40 MG capsule  . gabapentin (NEURONTIN) 300 MG capsule  . losartan-hydrochlorothiazide (HYZAAR) 100-25 MG tablet  . meloxicam (MOBIC) 15 MG tablet  . Probiotic Product (PROBIOTIC DAILY PO)  . simvastatin (ZOCOR) 20 MG tablet  . tiotropium (SPIRIVA) 18 MCG inhalation capsule  . Tiotropium Bromide-Olodaterol 2.5-2.5 MCG/ACT AERS   . 0.9 %  sodium chloride infusion    Myra Gianotti, PA-C Surgical Short Stay/Anesthesiology [facility] Phone [phone] [facility] Phone [phone] 12/28/2020 3:56 PM

## 2020-12-28 NOTE — Anesthesia Preprocedure Evaluation (Addendum)
Anesthesia Evaluation  Patient identified by MRN, date of birth, ID band Patient awake    Reviewed: Allergy & Precautions, NPO status , Patient's Chart, lab work & pertinent test results  History of Anesthesia Complications Negative for: history of anesthetic complications  Airway Mallampati: II  TM Distance: >3 FB Neck ROM: Full    Dental  (+) Edentulous Upper, Edentulous Lower   Pulmonary COPD, former smoker,    Pulmonary exam normal        Cardiovascular hypertension, Pt. on medications Normal cardiovascular exam     Neuro/Psych Anxiety Depression    GI/Hepatic negative GI ROS, Neg liver ROS,   Endo/Other  negative endocrine ROS  Renal/GU Renal disease (Cr 1.34)  negative genitourinary   Musculoskeletal  (+) Arthritis ,   Abdominal   Peds  Hematology  (+) anemia , Hgb 12.2   Anesthesia Other Findings  Echo 09/27/17: Study Conclusions  - Left ventricle: The cavity size was normal. Wall thickness was  normal. Systolic function was normal. The estimated ejection  fraction was in the range of 60% to 65%. Doppler parameters are  consistent with abnormal left ventricular relaxation (grade 1  diastolic dysfunction).   Nuclear stress test 09/27/17:  Nuclear stress EF: 54%. The left ventricular ejection fraction is mildly decreased (45-54%).  The study is normal. There is no evidence of ischemia and no evidence of previous infarction  This is a low risk study.   Reproductive/Obstetrics                           Anesthesia Physical Anesthesia Plan  ASA: III  Anesthesia Plan: General   Post-op Pain Management:    Induction: Intravenous  PONV Risk Score and Plan: 2 and Ondansetron, Dexamethasone, Treatment may vary due to age or medical condition and Midazolam  Airway Management Planned: Oral ETT and Video Laryngoscope Planned  Additional Equipment: None  Intra-op Plan:    Post-operative Plan: Extubation in OR  Informed Consent: I have reviewed the patients History and Physical, chart, labs and discussed the procedure including the risks, benefits and alternatives for the proposed anesthesia with the patient or authorized representative who has indicated his/her understanding and acceptance.     Dental advisory given  Plan Discussed with:   Anesthesia Plan Comments: (PAT note written 12/28/2020 by Myra Gianotti, PA-C. )      Anesthesia Quick Evaluation

## 2020-12-28 NOTE — Progress Notes (Signed)
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  States that symptoms unchanged from previous visit including unsteady gait and he is want to proceed with C4-5 and C5-6 ACDF.  Today history and physical performed.  Review of systems negative.  Surgical procedure discussed along with potential rehab/recovery time.  All questions answered.

## 2020-12-29 NOTE — H&P (Signed)
Johnny Brown is an 75 y.o. male.   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  States that symptoms unchanged from previous visit including unsteady gait and he is want to proceed with C4-5 and C5-6 ACDF.  Today history and physical performed  Past Medical History:  Diagnosis Date  . Allergy    seasonal allergies  . Anxiety    on meds  . Arthritis    back  . Cataract    bilateral   . Depression    on meds  . Diverticulosis   . Emphysema of lung ([place])    uses inhaler  . History of BPH   . Hyperlipidemia    MIXED--on meds  . Hyperplastic colonic polyp   . Hypertension    UNSPE  . Hypertension, benign    on meds  . Hypertension, essential   . Tobacco use disorder     Past Surgical History:  Procedure Laterality Date  . CATARACT EXTRACTION, BILATERAL    . COLONOSCOPY  2020   JMP-MAC-good prep=TICS/hems/multiple TA's  . COLONOSCOPY    . EYE SURGERY     Cataract Surgery Both Eyes  . POLYPECTOMY  2020   mulitple  . ROTATOR CUFF REPAIR Right 2003  . VASECTOMY      Family History  Problem Relation Age of Onset  . Lung cancer Father   . Heart attack Father   . Liver cancer Brother   . Colon cancer Paternal Uncle   . Colon polyps Son   . Esophageal cancer Neg Hx   . Rectal cancer Neg Hx   . Stomach cancer Neg Hx    Social History:  reports that he quit smoking about 22 months ago. His smoking use included pipe and cigarettes. He has a 61.00 pack-year smoking history. He has never used smokeless tobacco. He reports that he does not drink alcohol and does not use drugs.  Allergies:  Allergies  Allergen Reactions  . Lisinopril     cough     No medications prior to admission.    No results found for this or any previous visit (from the past 48 hour(s)). No results found.  Review of Systems   Constitutional: Positive for activity change.  HENT: Negative.   Respiratory: Negative.   Gastrointestinal: Negative.   Genitourinary: Negative.   Musculoskeletal: Positive for neck pain and neck stiffness.  Neurological: Positive for weakness and numbness.  Psychiatric/Behavioral: Negative.     There were no vitals taken for this visit. Physical Exam   Assessment/Plan C4-5 and C5-6 HNP/stenosis.  Cervical myelopathy.  We will proceed with C4-5 and C5-6 ACDF is scheduled.  Surgical procedure discussed.  All questions answered and he wishes to proceed.  Benjiman Core, PA-C 12/29/2020, 4:47 PM

## 2020-12-30 ENCOUNTER — Ambulatory Visit (HOSPITAL_COMMUNITY): Payer: Medicare PPO | Admitting: Vascular Surgery

## 2020-12-30 ENCOUNTER — Other Ambulatory Visit: Payer: Self-pay

## 2020-12-30 ENCOUNTER — Ambulatory Visit (HOSPITAL_COMMUNITY): Payer: Medicare PPO

## 2020-12-30 ENCOUNTER — Ambulatory Visit (HOSPITAL_COMMUNITY): Payer: Medicare PPO | Admitting: Anesthesiology

## 2020-12-30 ENCOUNTER — Encounter (HOSPITAL_COMMUNITY)
Admission: RE | Disposition: A | Discharge status: Home / Self Care | Payer: Self-pay | Source: Home / Self Care | Attending: Orthopaedic Surgery

## 2020-12-30 ENCOUNTER — Encounter (HOSPITAL_COMMUNITY): Payer: Self-pay | Admitting: Orthopaedic Surgery

## 2020-12-30 ENCOUNTER — Observation Stay (HOSPITAL_COMMUNITY)
Admission: RE | Admit: 2020-12-30 | Discharge: 2020-12-31 | Disposition: A | Discharge status: Home / Self Care | Payer: Medicare PPO | Attending: Orthopaedic Surgery | Admitting: Orthopaedic Surgery

## 2020-12-30 DIAGNOSIS — M4712 Other spondylosis with myelopathy, cervical region: Secondary | ICD-10-CM | POA: Diagnosis not present

## 2020-12-30 DIAGNOSIS — Z87891 Personal history of nicotine dependence: Secondary | ICD-10-CM | POA: Diagnosis not present

## 2020-12-30 DIAGNOSIS — I10 Essential (primary) hypertension: Secondary | ICD-10-CM | POA: Diagnosis not present

## 2020-12-30 DIAGNOSIS — Z419 Encounter for procedure for purposes other than remedying health state, unspecified: Secondary | ICD-10-CM

## 2020-12-30 DIAGNOSIS — M4802 Spinal stenosis, cervical region: Secondary | ICD-10-CM | POA: Diagnosis present

## 2020-12-30 HISTORY — PX: ANTERIOR CERVICAL DECOMP/DISCECTOMY FUSION: SHX1161

## 2020-12-30 LAB — ABO/RH: ABO/RH(D): AB POS

## 2020-12-30 LAB — TYPE AND SCREEN
ABO/RH(D): AB POS
Antibody Screen: NEGATIVE

## 2020-12-30 SURGERY — ANTERIOR CERVICAL DECOMPRESSION/DISCECTOMY FUSION 2 LEVELS
Anesthesia: General

## 2020-12-30 MED ORDER — ONDANSETRON HCL 4 MG/2ML IJ SOLN
INTRAMUSCULAR | Status: AC
  Filled 2020-12-30: qty 2

## 2020-12-30 MED ORDER — CHLORHEXIDINE GLUCONATE 0.12 % MT SOLN
15.0000 mL | Freq: Once | OROMUCOSAL | Status: AC
  Administered 2020-12-30: 15 mL via OROMUCOSAL
  Filled 2020-12-30: qty 15

## 2020-12-30 MED ORDER — ONDANSETRON HCL 4 MG/2ML IJ SOLN: 4.0000 mg | Freq: Four times a day (QID) | INTRAMUSCULAR | Status: DC | PRN

## 2020-12-30 MED ORDER — SODIUM CHLORIDE 0.9 % IV SOLN: INTRAVENOUS | Status: DC

## 2020-12-30 MED ORDER — AMLODIPINE BESYLATE 5 MG PO TABS
5.0000 mg | ORAL_TABLET | Freq: Every day | ORAL | Status: DC
  Administered 2020-12-30: 5 mg via ORAL
  Filled 2020-12-30: qty 1

## 2020-12-30 MED ORDER — ORAL CARE MOUTH RINSE: 15.0000 mL | Freq: Once | OROMUCOSAL | Status: AC

## 2020-12-30 MED ORDER — HYDROCHLOROTHIAZIDE 25 MG PO TABS: 25.0000 mg | ORAL_TABLET | Freq: Every day | ORAL | Status: DC

## 2020-12-30 MED ORDER — DEXAMETHASONE SODIUM PHOSPHATE 10 MG/ML IJ SOLN
INTRAMUSCULAR | Status: DC | PRN
  Administered 2020-12-30: 10 mg via INTRAVENOUS

## 2020-12-30 MED ORDER — MENTHOL 3 MG MT LOZG: 1.0000 | LOZENGE | OROMUCOSAL | Status: DC | PRN

## 2020-12-30 MED ORDER — DEXAMETHASONE SODIUM PHOSPHATE 10 MG/ML IJ SOLN
INTRAMUSCULAR | Status: AC
  Filled 2020-12-30: qty 1

## 2020-12-30 MED ORDER — OXYCODONE HCL 5 MG PO TABS
ORAL_TABLET | ORAL | Status: AC
  Filled 2020-12-30: qty 1

## 2020-12-30 MED ORDER — SIMVASTATIN 20 MG PO TABS
20.0000 mg | ORAL_TABLET | Freq: Every day | ORAL | Status: DC
  Administered 2020-12-30: 20 mg via ORAL
  Filled 2020-12-30: qty 1

## 2020-12-30 MED ORDER — DOCUSATE SODIUM 100 MG PO CAPS
100.0000 mg | ORAL_CAPSULE | Freq: Two times a day (BID) | ORAL | Status: DC
  Administered 2020-12-30: 100 mg via ORAL
  Filled 2020-12-30: qty 1

## 2020-12-30 MED ORDER — METHOCARBAMOL 500 MG PO TABS: 500.0000 mg | ORAL_TABLET | Freq: Four times a day (QID) | ORAL | 0 refills | Status: AC | PRN

## 2020-12-30 MED ORDER — 0.9 % SODIUM CHLORIDE (POUR BTL) OPTIME
TOPICAL | Status: DC | PRN
  Administered 2020-12-30: 1000 mL

## 2020-12-30 MED ORDER — LACTATED RINGERS IV SOLN: INTRAVENOUS | Status: DC

## 2020-12-30 MED ORDER — UMECLIDINIUM BROMIDE 62.5 MCG/INH IN AEPB
1.0000 | INHALATION_SPRAY | Freq: Every day | RESPIRATORY_TRACT | Status: DC
  Filled 2020-12-30: qty 7

## 2020-12-30 MED ORDER — HEMOSTATIC AGENTS (NO CHARGE) OPTIME
TOPICAL | Status: DC | PRN
  Administered 2020-12-30: 1 via TOPICAL

## 2020-12-30 MED ORDER — OXYCODONE-ACETAMINOPHEN 5-325 MG PO TABS: 1.0000 | ORAL_TABLET | ORAL | 0 refills | Status: DC | PRN

## 2020-12-30 MED ORDER — POLYETHYLENE GLYCOL 3350 17 G PO PACK: 17.0000 g | PACK | Freq: Every day | ORAL | Status: DC

## 2020-12-30 MED ORDER — PROPOFOL 10 MG/ML IV BOLUS
INTRAVENOUS | Status: DC | PRN
  Administered 2020-12-30: 150 mg via INTRAVENOUS

## 2020-12-30 MED ORDER — ONDANSETRON HCL 4 MG PO TABS: 4.0000 mg | ORAL_TABLET | Freq: Four times a day (QID) | ORAL | Status: DC | PRN

## 2020-12-30 MED ORDER — OXYCODONE HCL 5 MG/5ML PO SOLN: 5.0000 mg | Freq: Once | ORAL | Status: AC | PRN

## 2020-12-30 MED ORDER — LIDOCAINE 2% (20 MG/ML) 5 ML SYRINGE
INTRAMUSCULAR | Status: AC
  Filled 2020-12-30: qty 5

## 2020-12-30 MED ORDER — FENTANYL CITRATE (PF) 250 MCG/5ML IJ SOLN
INTRAMUSCULAR | Status: AC
  Filled 2020-12-30: qty 5

## 2020-12-30 MED ORDER — OXYCODONE HCL 5 MG PO TABS
5.0000 mg | ORAL_TABLET | ORAL | Status: DC | PRN
  Administered 2020-12-30 – 2020-12-31 (×3): 5 mg via ORAL
  Filled 2020-12-30 (×3): qty 1

## 2020-12-30 MED ORDER — ROCURONIUM BROMIDE 10 MG/ML (PF) SYRINGE
PREFILLED_SYRINGE | INTRAVENOUS | Status: DC | PRN
  Administered 2020-12-30: 30 mg via INTRAVENOUS
  Administered 2020-12-30: 70 mg via INTRAVENOUS

## 2020-12-30 MED ORDER — ONDANSETRON HCL 4 MG/2ML IJ SOLN
INTRAMUSCULAR | Status: DC | PRN
  Administered 2020-12-30: 4 mg via INTRAVENOUS

## 2020-12-30 MED ORDER — PROPOFOL 10 MG/ML IV BOLUS
INTRAVENOUS | Status: AC
  Filled 2020-12-30: qty 20

## 2020-12-30 MED ORDER — METHOCARBAMOL 500 MG PO TABS
500.0000 mg | ORAL_TABLET | Freq: Four times a day (QID) | ORAL | Status: DC | PRN
  Administered 2020-12-30 – 2020-12-31 (×3): 500 mg via ORAL
  Filled 2020-12-30 (×3): qty 1

## 2020-12-30 MED ORDER — PHENOL 1.4 % MT LIQD: 1.0000 | OROMUCOSAL | Status: DC | PRN

## 2020-12-30 MED ORDER — BUPIVACAINE HCL 0.25 % IJ SOLN
INTRAMUSCULAR | Status: DC | PRN
  Administered 2020-12-30: 6 mL

## 2020-12-30 MED ORDER — ONDANSETRON HCL 4 MG/2ML IJ SOLN: 4.0000 mg | Freq: Once | INTRAMUSCULAR | Status: DC | PRN

## 2020-12-30 MED ORDER — ROCURONIUM BROMIDE 10 MG/ML (PF) SYRINGE
PREFILLED_SYRINGE | INTRAVENOUS | Status: AC
  Filled 2020-12-30: qty 10

## 2020-12-30 MED ORDER — SODIUM CHLORIDE 0.9% FLUSH: 3.0000 mL | INTRAVENOUS | Status: DC | PRN

## 2020-12-30 MED ORDER — LOSARTAN POTASSIUM 50 MG PO TABS: 100.0000 mg | ORAL_TABLET | Freq: Every day | ORAL | Status: DC

## 2020-12-30 MED ORDER — ACETAMINOPHEN 325 MG PO TABS
650.0000 mg | ORAL_TABLET | ORAL | Status: DC | PRN
  Administered 2020-12-30: 650 mg via ORAL
  Filled 2020-12-30: qty 2

## 2020-12-30 MED ORDER — LORATADINE 10 MG PO TABS
10.0000 mg | ORAL_TABLET | Freq: Every day | ORAL | Status: DC
  Administered 2020-12-30: 10 mg via ORAL
  Filled 2020-12-30: qty 1

## 2020-12-30 MED ORDER — PROBIOTIC DAILY PO CAPS: ORAL_CAPSULE | Freq: Every day | ORAL | Status: DC

## 2020-12-30 MED ORDER — CEFAZOLIN SODIUM-DEXTROSE 2-4 GM/100ML-% IV SOLN
2.0000 g | INTRAVENOUS | Status: AC
  Administered 2020-12-30: 2 g via INTRAVENOUS
  Filled 2020-12-30: qty 100

## 2020-12-30 MED ORDER — LIDOCAINE 2% (20 MG/ML) 5 ML SYRINGE
INTRAMUSCULAR | Status: DC | PRN
  Administered 2020-12-30: 80 mg via INTRAVENOUS

## 2020-12-30 MED ORDER — SODIUM CHLORIDE 0.9% FLUSH: 3.0000 mL | Freq: Two times a day (BID) | INTRAVENOUS | Status: DC

## 2020-12-30 MED ORDER — FENTANYL CITRATE (PF) 100 MCG/2ML IJ SOLN
INTRAMUSCULAR | Status: AC
  Filled 2020-12-30: qty 2

## 2020-12-30 MED ORDER — HYDROMORPHONE HCL 1 MG/ML IJ SOLN
0.5000 mg | INTRAMUSCULAR | Status: DC | PRN
  Administered 2020-12-30: 0.5 mg via INTRAVENOUS
  Filled 2020-12-30: qty 0.5

## 2020-12-30 MED ORDER — METHOCARBAMOL 1000 MG/10ML IJ SOLN
500.0000 mg | Freq: Four times a day (QID) | INTRAVENOUS | Status: DC | PRN
  Filled 2020-12-30: qty 5

## 2020-12-30 MED ORDER — TIOTROPIUM BROMIDE MONOHYDRATE 18 MCG IN CAPS: 18.0000 ug | ORAL_CAPSULE | Freq: Every day | RESPIRATORY_TRACT | Status: DC

## 2020-12-30 MED ORDER — AMISULPRIDE (ANTIEMETIC) 5 MG/2ML IV SOLN: 10.0000 mg | Freq: Once | INTRAVENOUS | Status: DC | PRN

## 2020-12-30 MED ORDER — FLUOXETINE HCL 20 MG PO CAPS
40.0000 mg | ORAL_CAPSULE | Freq: Every day | ORAL | Status: DC
  Administered 2020-12-30: 40 mg via ORAL
  Filled 2020-12-30: qty 2

## 2020-12-30 MED ORDER — LOSARTAN POTASSIUM-HCTZ 100-25 MG PO TABS: 1.0000 | ORAL_TABLET | Freq: Every day | ORAL | Status: DC

## 2020-12-30 MED ORDER — DOXAZOSIN MESYLATE 4 MG PO TABS
4.0000 mg | ORAL_TABLET | Freq: Every day | ORAL | Status: DC
  Filled 2020-12-30: qty 1

## 2020-12-30 MED ORDER — BUPIVACAINE HCL (PF) 0.25 % IJ SOLN
INTRAMUSCULAR | Status: AC
  Filled 2020-12-30: qty 30

## 2020-12-30 MED ORDER — BUPROPION HCL ER (XL) 150 MG PO TB24
150.0000 mg | ORAL_TABLET | Freq: Every day | ORAL | Status: DC
  Filled 2020-12-30: qty 1

## 2020-12-30 MED ORDER — FENTANYL CITRATE (PF) 250 MCG/5ML IJ SOLN
INTRAMUSCULAR | Status: DC | PRN
  Administered 2020-12-30 (×5): 50 ug via INTRAVENOUS

## 2020-12-30 MED ORDER — SUGAMMADEX SODIUM 200 MG/2ML IV SOLN
INTRAVENOUS | Status: DC | PRN
  Administered 2020-12-30: 200 mg via INTRAVENOUS

## 2020-12-30 MED ORDER — FENTANYL CITRATE (PF) 100 MCG/2ML IJ SOLN
25.0000 ug | INTRAMUSCULAR | Status: DC | PRN
  Administered 2020-12-30 (×2): 50 ug via INTRAVENOUS

## 2020-12-30 MED ORDER — ACETAMINOPHEN 650 MG RE SUPP
650.0000 mg | RECTAL | Status: DC | PRN
Start: 2020-12-30 — End: 2020-12-31

## 2020-12-30 MED ORDER — GABAPENTIN 300 MG PO CAPS
300.0000 mg | ORAL_CAPSULE | Freq: Every day | ORAL | Status: DC
  Administered 2020-12-30: 300 mg via ORAL
  Filled 2020-12-30: qty 1

## 2020-12-30 MED ORDER — DOXAZOSIN MESYLATE 4 MG PO TABS: 4.0000 mg | ORAL_TABLET | Freq: Every day | ORAL | Status: DC

## 2020-12-30 MED ORDER — OXYCODONE HCL 5 MG PO TABS
5.0000 mg | ORAL_TABLET | Freq: Once | ORAL | Status: AC | PRN
  Administered 2020-12-30: 5 mg via ORAL

## 2020-12-30 MED ORDER — BUPROPION HCL ER (XL) 150 MG PO TB24: 150.0000 mg | ORAL_TABLET | Freq: Every day | ORAL | Status: DC

## 2020-12-30 SURGICAL SUPPLY — 56 items
AGENT HMST KT MTR STRL THRMB (HEMOSTASIS) ×1
APL SKNCLS STERI-STRIP NONHPOA (GAUZE/BANDAGES/DRESSINGS) ×1
BENZOIN TINCTURE PRP APPL 2/3 (GAUZE/BANDAGES/DRESSINGS) ×2 IMPLANT
BIT DRILL SMALL W/STOP 14 (BIT) ×2 IMPLANT
BLADE CLIPPER SURG (BLADE) IMPLANT
BONE CC-ACS 11X14 X8 6D (Bone Implant) ×2 IMPLANT
BONE CC-ACS 11X14X7 6D (Bone Implant) ×2 IMPLANT
BUR ROUND FLUTED 4 SOFT TCH (BURR) ×2 IMPLANT
CHIPS BONE CANC-ACS 11X14X8 6D (Bone Implant) ×1 IMPLANT
CHIPS BONE CANC-ACS11X14X7 6D (Bone Implant) ×1 IMPLANT
COLLAR CERV LO CONTOUR FIRM DE (SOFTGOODS) IMPLANT
CORD BIPOLAR FORCEPS 12FT (ELECTRODE) ×2 IMPLANT
COVER SURGICAL LIGHT HANDLE (MISCELLANEOUS) IMPLANT
COVER WAND RF STERILE (DRAPES) ×2 IMPLANT
DRAPE C-ARM 42X72 X-RAY (DRAPES) ×4 IMPLANT
DRAPE HALF SHEET 40X57 (DRAPES) ×2 IMPLANT
DRAPE MICROSCOPE LEICA (MISCELLANEOUS) ×2 IMPLANT
DURAPREP 6ML APPLICATOR 50/CS (WOUND CARE) ×2 IMPLANT
ELECT COATED BLADE 2.86 ST (ELECTRODE) ×2 IMPLANT
ELECT REM PT RETURN 9FT ADLT (ELECTROSURGICAL) ×2
ELECTRODE REM PT RTRN 9FT ADLT (ELECTROSURGICAL) ×1 IMPLANT
EVACUATOR 1/8 PVC DRAIN (DRAIN) ×2 IMPLANT
GAUZE SPONGE 4X4 12PLY STRL (GAUZE/BANDAGES/DRESSINGS) ×2 IMPLANT
GLOVE ORTHO TXT STRL SZ7.5 (GLOVE) ×4 IMPLANT
GLOVE SRG 8 PF TXTR STRL LF DI (GLOVE) ×2 IMPLANT
GLOVE SURG UNDER POLY LF SZ8 (GLOVE) ×4
GOWN STRL REUS W/ TWL LRG LVL3 (GOWN DISPOSABLE) ×1 IMPLANT
GOWN STRL REUS W/ TWL XL LVL3 (GOWN DISPOSABLE) ×1 IMPLANT
GOWN STRL REUS W/TWL 2XL LVL3 (GOWN DISPOSABLE) ×2 IMPLANT
GOWN STRL REUS W/TWL LRG LVL3 (GOWN DISPOSABLE) ×2
GOWN STRL REUS W/TWL XL LVL3 (GOWN DISPOSABLE) ×2
HALTER HD/CHIN CERV TRACTION D (MISCELLANEOUS) ×2 IMPLANT
HEMOSTAT SURGICEL 2X14 (HEMOSTASIS) IMPLANT
KIT BASIN OR (CUSTOM PROCEDURE TRAY) ×2 IMPLANT
KIT TURNOVER KIT B (KITS) ×2 IMPLANT
MANIFOLD NEPTUNE II (INSTRUMENTS) IMPLANT
NEEDLE 25GX 5/8IN NON SAFETY (NEEDLE) ×2 IMPLANT
NS IRRIG 1000ML POUR BTL (IV SOLUTION) ×2 IMPLANT
PACK ORTHO CERVICAL (CUSTOM PROCEDURE TRAY) ×2 IMPLANT
PAD ARMBOARD 7.5X6 YLW CONV (MISCELLANEOUS) IMPLANT
PATTIES SURGICAL .5 X.5 (GAUZE/BANDAGES/DRESSINGS) IMPLANT
PIN FIXATION SMALL TEMP (ORTHOPEDIC DISPOSABLE SUPPLIES) ×2 IMPLANT
PLATE ANT CERV XTEND 2 LV (Plate) ×2 IMPLANT
POSITIONER HEAD DONUT 9IN (MISCELLANEOUS) ×2 IMPLANT
RESTRAINT LIMB HOLDER UNIV (RESTRAINTS) ×2 IMPLANT
SCREW XTD VAR 4.2 SELF TAP (Screw) ×12 IMPLANT
STRIP CLOSURE SKIN 1/2X4 (GAUZE/BANDAGES/DRESSINGS) ×2 IMPLANT
SURGIFLO W/THROMBIN 8M KIT (HEMOSTASIS) ×2 IMPLANT
SUT BONE WAX W31G (SUTURE) ×2 IMPLANT
SUT VIC AB 3-0 X1 27 (SUTURE) ×2 IMPLANT
SUT VICRYL 4-0 PS2 18IN ABS (SUTURE) ×4 IMPLANT
TAPE CLOTH SURG 4X10 WHT LF (GAUZE/BANDAGES/DRESSINGS) ×2 IMPLANT
TOWEL GREEN STERILE (TOWEL DISPOSABLE) ×2 IMPLANT
TOWEL GREEN STERILE FF (TOWEL DISPOSABLE) ×2 IMPLANT
TRAY FOLEY W/BAG SLVR 16FR (SET/KITS/TRAYS/PACK)
TRAY FOLEY W/BAG SLVR 16FR ST (SET/KITS/TRAYS/PACK) IMPLANT

## 2020-12-30 NOTE — Transfer of Care (Signed)
Immediate Anesthesia Transfer of Care Note  Patient: FAVOR HACKLER  Procedure(s) Performed: ANTERIOR CERVICAL DISCECTOMY FUSION C4-5. C5-6, allograft, plate (N/A )  Patient Location: PACU  Anesthesia Type:General  Level of Consciousness: drowsy and patient cooperative  Airway & Oxygen Therapy: Patient Spontanous Breathing and Patient connected to nasal cannula oxygen  Post-op Assessment: Report given to RN and Post -op Vital signs reviewed and stable  Post vital signs: Reviewed and stable  Last Vitals:  Vitals Value Taken Time  BP 176/78 12/30/20 1015  Temp 37.1 C 12/30/20 1015  Pulse 83 12/30/20 1019  Resp 17 12/30/20 1019  SpO2 91 % 12/30/20 1019  Vitals shown include unvalidated device data.  Last Pain:  Vitals:   12/30/20 0634  TempSrc:   PainSc: 0-No pain         Complications: No complications documented.

## 2020-12-30 NOTE — Interval H&P Note (Signed)
History and Physical Interval Note:  12/30/2020 7:20 AM  Johnny Brown  has presented today for surgery, with the diagnosis of C4-5, C5-6 spondylosis, stenosis.  The various methods of treatment have been discussed with the patient and family. After consideration of risks, benefits and other options for treatment, the patient has consented to  Procedure(s): ANTERIOR CERVICAL DISCECTOMY FUSION C4-5. C5-6, allograft, plate (N/A) as a surgical intervention.  The patient's history has been reviewed, patient examined, no change in status, stable for surgery.  I have reviewed the patient's chart and labs.  Questions were answered to the patient's satisfaction.     Jr Milliron C Clydene Burack   

## 2020-12-30 NOTE — Anesthesia Procedure Notes (Addendum)
Procedure Name: Intubation Date/Time: 12/30/2020 8:08 AM Performed by: Kathryne Hitch, CRNA Pre-anesthesia Checklist: Patient identified, Emergency Drugs available, Suction available and Patient being monitored Patient Re-evaluated:Patient Re-evaluated prior to induction Oxygen Delivery Method: Circle system utilized Preoxygenation: Pre-oxygenation with 100% oxygen Induction Type: IV induction Ventilation: Mask ventilation without difficulty Laryngoscope Size: Mac and 4 Grade View: Grade I Tube type: Oral Tube size: 7.5 mm Number of attempts: 1 Airway Equipment and Method: Stylet and Oral airway Placement Confirmation: ETT inserted through vocal cords under direct vision,  positive ETCO2 and breath sounds checked- equal and bilateral Secured at: 23 cm Tube secured with: Tape Dental Injury: Teeth and Oropharynx as per pre-operative assessment  Comments: Clearnce Sorrel, paramedic student DVL x1 + esophageal intubation. CRNA DVL x1.

## 2020-12-30 NOTE — Discharge Instructions (Addendum)
Ok to shower 1 days postop.    Shower with the extra collar wrapped in saran wrap. After you dry off take the collar off and reapply dry collar. No shaving.   Do not apply any creams or ointments to incision.   Do not remove steri-strips.   Can use 4x4 gauze and tape for dressing changes if your dressing falls off.    No aggressive activity.  Cervical collar must be on at all times including  when showering.  Do not bend or turn neck.    No driving No lifting, pushing, pulling, mowing (riding or push)

## 2020-12-30 NOTE — Anesthesia Postprocedure Evaluation (Signed)
Anesthesia Post Note  Patient: Johnny Brown  Procedure(s) Performed: ANTERIOR CERVICAL DISCECTOMY FUSION C4-5. C5-6, allograft, plate (N/A )     Patient location during evaluation: PACU Anesthesia Type: General Level of consciousness: awake and alert Pain management: pain level controlled Vital Signs Assessment: post-procedure vital signs reviewed and stable Respiratory status: spontaneous breathing, nonlabored ventilation and respiratory function stable Cardiovascular status: blood pressure returned to baseline and stable Postop Assessment: no apparent nausea or vomiting Anesthetic complications: no   No complications documented.  Last Vitals:  Vitals:   12/30/20 1100 12/30/20 1125  BP: (!) 146/93 (!) 151/80  Pulse: 84 83  Resp: 20 20  Temp: 37.1 C (!) 36.4 C  SpO2: 95% 97%    Last Pain:  Vitals:   12/30/20 1137  TempSrc:   PainSc: 3                  Lidia Collum

## 2020-12-30 NOTE — Interval H&P Note (Signed)
History and Physical Interval Note:  12/30/2020 7:20 AM  Johnny Brown  has presented today for surgery, with the diagnosis of C4-5, C5-6 spondylosis, stenosis.  The various methods of treatment have been discussed with the patient and family. After consideration of risks, benefits and other options for treatment, the patient has consented to  Procedure(s): ANTERIOR CERVICAL DISCECTOMY FUSION C4-5. C5-6, allograft, plate (N/A) as a surgical intervention.  The patient's history has been reviewed, patient examined, no change in status, stable for surgery.  I have reviewed the patient's chart and labs.  Questions were answered to the patient's satisfaction.     [name]

## 2020-12-30 NOTE — Op Note (Signed)
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  Surgeon: Rodell Perna, MD  Anesthesia: General orotracheal +6 cc local  Assistant: Benjiman Core, PA-C medically necessary and present for the entire procedure  Drains 1 Hemovac neck  EBL 100cc  Implants: Xtend 34 mm Globus plate 14 mm screws x6.  MTF graft lordotic 8 mm at C4-5 and 7 mm at C5-6.  Procedure: After induction general anesthesia orotracheal ovation arms tucked at the side with padding wrist restraints cervical traction taped but no weight applied neck was prepped with DuraPrep there is squared with towels sterile skin marker Betadine Steri-Drape and sterile Mayo stand at the head thyroid sheets and drapes.  Incision was made after timeout starting the midline Ancef prophylaxis had been given 2 g.  Incision started at the midline extended the left.  Platysma was split in line with fibers prominent vein was saved blunt dissection down the prominent spur and short 25 needle was placed this was checked with sterilely draped C-arm which showed it was at see 5-6 level.  Immediately a chunk of disc was taken out for marking.  Self-retaining Cloward retractors were then placed at the C4-5 level which had cord myelomalacia early changes and patient had early myelopathic problems with balance problems and gait.  C4-5 was exposed taking off the prominent anterior spurs.  Operative microscope was draped brought in.  Removed disc progressing back posterior longitudinal ligament doing 100 decompression so that an 8 graft was necessary.  Posterior longitudinal ligament was completely taken down until the dura was able to bulge into the area and uncovertebral joints were debrided with the Cloward curette and undercutting with a 2 mm Kerrison.  Careful palpation no extruded fragments trial sizing an 8 mm graft was  placed using the CRNA to pull and graft was countersunk 2 mm.  Identical procedure was repeated at the C5-6 level which had spondylosis no cord myelomalacia and had mild to moderate stenosis with moderate to severe foraminal stenosis.  Foramina were opened up using the Kerrisons operative microscope to complete takedown of posterior longitudinal ligament for decompression of the cord.  7 mm graft gave tight fit countersunk.  We tried 32 mm plate and then progressed to 34 plate.  Screws were placed intermittently checking with C arm all 6 screws were placed AP lateral pictures were obtained which showed good position alignment.  Tiny screwdriver was used to lock down all the locking screws to the plate Hemovac was placed in and out technique on the left side in line with the skin incision.  Prominent vein was reinspected was normal copious irrigation.  Reapproximation platysma 3-0 Vicryl 4-0 Vicryl subcuticular closure tincture benzoin Steri-Strips Marcaine titration 6 cc 4 x 4's tape and soft cervical collar was applied patient tolerated procedure well with neurologically intact with good strength upper and lower extremities.

## 2020-12-30 NOTE — Progress Notes (Signed)
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 ANTERIOR CERVICAL DISCECTOMY FUSION C4-5. C5-6   Clinical Impression   Pt was evaluated for the able diagnoses/treatment, he reported little to no pain and was eager to participate with therapy. Pt was indep prior to this admission with all ADL/IADLs. He lives in a 2 level home, 3 STE, with his wife who can provide 24/7 supervision. He plans to sleep in recliner on first level upon d/c where he has access to a walk in shower and shower seat. Pt was min guard for functional ambulation for safety, and set up level for upper ADLS. Lower ADLs required min A and education of AE and compensatory techniques, pt verbalized great understanding. Pt was given cervical precaution hand outs, and demonstrated good ability to adhere to all precautions and log rolling. Pt does not need further OT services acutely and recommended to follow the surgeons d/c plan, no OT needed. Recommend supervision for all ADLs and mobility initially upon d/c home.     Follow Up Recommendations  Supervision - Intermittent;No OT follow up    Equipment Recommendations  None recommended by OT       Precautions / Restrictions Precautions Precautions: Cervical;Fall Precaution Booklet Issued: Yes (comment) Precaution Comments: Pt demonstrated good ability to adhere to cervical precautions, verbalized understanding Required Braces or Orthoses: Cervical Brace Cervical Brace: Soft collar;At all times Restrictions Weight Bearing Restrictions: No      Mobility Bed Mobility Overal bed mobility: Needs Assistance Bed Mobility: Rolling;Sidelying to Sit Rolling: Supervision Sidelying to sit: Supervision       General bed mobility comments: vc for log rolling     Transfers Overall transfer level: Needs assistance Equipment used: None Transfers: Sit to/from Stand Sit to Stand: Min guard         General transfer comment: min guard for safety    Balance Overall balance assessment: Needs assistance Sitting-balance support: No upper extremity supported Sitting balance-Leahy Scale: Good     Standing balance support: No upper extremity supported Standing balance-Leahy Scale: Fair                             ADL either performed or assessed with clinical judgement   ADL Overall ADL's : Needs assistance/impaired Eating/Feeding: Independent;Sitting   Grooming: Wash/dry hands;Wash/dry face;Oral care;Applying deodorant;Supervision/safety;Standing (at sink)   Upper Body Bathing: Supervision/ safety;Sitting;Cueing for compensatory techniques   Lower Body Bathing: Minimal assistance;Cueing for compensatory techniques;Sit to/from stand   Upper Body Dressing : Set up;Sitting   Lower Body Dressing: Minimal assistance;Sit to/from stand;Cueing for compensatory techniques;Cueing for safety   Toilet Transfer: Min guard;Ambulation;Comfort height toilet;Grab bars   Toileting- Clothing Manipulation and Hygiene: Supervision/safety;Sit to/from stand       Functional mobility during ADLs: Min guard;Cueing for safety General ADL Comments: Pt required education for compensatory techniques to maintain cervical precautions during lower body ADLs, demonstrated good understanding         Hand Dominance Right   Extremity/Trunk Assessment Upper Extremity Assessment Upper Extremity Assessment: Overall WFL for tasks assessed   Lower Extremity Assessment Lower Extremity Assessment: Overall WFL for tasks assessed   Cervical / Trunk Assessment Cervical / Trunk Assessment: Normal;Other exceptions (incrsaed body habitus)   Communication Communication Communication: No difficulties  Cognition Arousal/Alertness: Awake/alert Behavior  During Therapy: WFL for tasks assessed/performed Overall Cognitive Status: Within Functional Limits for tasks assessed               General Comments  Pt with soft collar donned and ted hose, no apparent skin integrity issues noted; spent time educating pt on cervical precautions and neck brace management.            Home Living Family/patient expects to be discharged to:: Private residence Living Arrangements: Spouse/significant other Available Help at Discharge: Family;Available 24 hours/day Type of Home: House Home Access: Stairs to enter [employer] of Steps: 3 Entrance Stairs-Rails: None Home Layout: Two level;Full bath on main level (Plans to sleep in recliner on 1st level) Alternate Level Stairs-Number of Steps: 14 Alternate Level Stairs-Rails: Right Bathroom Shower/Tub: Tub/shower unit;Walk-in shower   Bathroom Toilet: Standard Bathroom Accessibility: Yes How Accessible: Accessible via walker Home Equipment: Bedside commode;Shower seat          Prior Functioning/Environment Level of Independence: Independent        Comments: Pt is retired, drives        OT Problem List: Decreased strength;Decreased range of motion;Decreased activity tolerance;Impaired balance (sitting and/or standing);Pain;Decreased knowledge of use of DME or AE;Decreased safety awareness;Decreased knowledge of precautions      OT Treatment/Interventions:      OT Goals(Current goals can be found in the care plan section) Acute Rehab OT Goals Patient Stated Goal: to go home  OT Frequency:      AM-PAC OT "6 Clicks" Daily Activity     Outcome Measure Help from another person eating meals?: None   Help from another person toileting, which includes using toliet, bedpan, or urinal?: A Little Help from another person bathing (including washing, rinsing, drying)?: A Little Help from another person to put on and taking off regular upper body clothing?: A Little Help from another  person to put on and taking off regular lower body clothing?: A Little 6 Click Score: 16   End of Session    Activity Tolerance:   Patient left:    OT Visit Diagnosis: Unsteadiness on feet (R26.81);Other abnormalities of gait and mobility (R26.89);Muscle weakness (generalized) (M62.81);Pain Pain - Right/Left:  (neck) Pain - part of body:  (neck)                Time: 5093-2671 OT Time Calculation (min): 23 min Charges:  OT General Charges $OT Visit: 1 Visit OT Treatments $Self Care/Home Management : 8-22 mins    Chiniqua Kilcrease A Bernice Mullin 12/30/2020, 4:18 PM

## 2020-12-31 DIAGNOSIS — M4712 Other spondylosis with myelopathy, cervical region: Secondary | ICD-10-CM | POA: Diagnosis not present

## 2020-12-31 NOTE — Progress Notes (Signed)
Patient is discharged from room 3C08 at this time. Alert and in stable condition. IV site d/c'd and instructions read to patient and spouse with understanding verbalized and all questions answered. Left unit via wheelchair with all belongings at side.

## 2020-12-31 NOTE — Progress Notes (Signed)
   Subjective: 1 Day Post-Op Procedure(s) (LRB): ANTERIOR CERVICAL DISCECTOMY FUSION C4-5. C5-6, allograft, plate (N/A) Patient reports pain as mild.    Objective: Vital signs in last 24 hours: Temp:  [97.5 F (36.4 C)-98.8 F (37.1 C)] 97.6 F (36.4 C) (04/30 0736) Pulse Rate:  [66-94] 71 (04/30 0736) Resp:  [17-20] 19 (04/30 0736) BP: (140-176)/(59-93) 146/68 (04/30 0736) SpO2:  [94 %-99 %] 99 % (04/30 0736)  Intake/Output from previous day: 04/29 0701 - 04/30 0700 In: 1460 [P.O.:360; I.V.:1000; IV Piggyback:100] Out: 160 [Drains:60; Blood:100] Intake/Output this shift: No intake/output data recorded.  No results for input(s): HGB in the last 72 hours. No results for input(s): WBC, RBC, HCT, PLT in the last 72 hours. No results for input(s): NA, K, CL, CO2, BUN, CREATININE, GLUCOSE, CALCIUM in the last 72 hours. No results for input(s): LABPT, INR in the last 72 hours.  Neurologically intact DG Cervical Spine 2 or 3 views  Result Date: 12/30/2020 CLINICAL DATA:  Surgery, elective. Additional history provided: Anterior cervical discectomy fusion C4-5. C5-6, allograft, plate. Provided fluoroscopy time 19 seconds (6.32 mGy). EXAM: CERVICAL SPINE - 2-3 VIEW; DG C-ARM 1-60 MIN COMPARISON:  Cervical spine MRI 12/01/2020. FINDINGS: PA and lateral view intraoperative fluoroscopic images of the cervical spine are submitted, 3 images total. On the provided images, ACDF hardware (ventral plate and screws) is present at C4-C6. Interbody devices are also present at C4-C5 and C5-C6. No unexpected finding on the provided views. Partially visualized ET tube. IMPRESSION: Three intraoperative fluoroscopic images from C4-C6 ACDF, as described. Electronically Signed   By: Kellie Simmering DO   On: 12/30/2020 10:17   DG C-Arm 1-60 Min  Result Date: 12/30/2020 CLINICAL DATA:  Surgery, elective. Additional history provided: Anterior cervical discectomy fusion C4-5. C5-6, allograft, plate. Provided  fluoroscopy time 19 seconds (6.32 mGy). EXAM: CERVICAL SPINE - 2-3 VIEW; DG C-ARM 1-60 MIN COMPARISON:  Cervical spine MRI 12/01/2020. FINDINGS: PA and lateral view intraoperative fluoroscopic images of the cervical spine are submitted, 3 images total. On the provided images, ACDF hardware (ventral plate and screws) is present at C4-C6. Interbody devices are also present at C4-C5 and C5-C6. No unexpected finding on the provided views. Partially visualized ET tube. IMPRESSION: Three intraoperative fluoroscopic images from C4-C6 ACDF, as described. Electronically Signed   By: Kellie Simmering DO   On: 12/30/2020 10:17    Assessment/Plan: 1 Day Post-Op Procedure(s) (LRB): ANTERIOR CERVICAL DISCECTOMY FUSION C4-5. C5-6, allograft, plate (N/A) Plan: discharge home , extra collar. Office 5/11  Johnny Brown 12/31/2020, 8:56 AM

## 2020-12-31 NOTE — Progress Notes (Signed)
Orthopedic Tech Progress Note Patient Details:  Johnny Brown 1946-05-20 [id number] Handed soft collar to patient Ortho Devices Type of Ortho Device: Soft collar Ortho Device/Splint Interventions: Ordered       [name] 12/31/2020, 9:05 AM

## 2021-01-02 ENCOUNTER — Encounter (HOSPITAL_COMMUNITY): Payer: Self-pay | Admitting: Orthopaedic Surgery

## 2021-01-10 NOTE — Discharge Summary (Signed)
Patient ID: Johnny Brown MRN: [id number] DOB/AGE: 09-24-1945 75 y.o.  Admit date: 12/30/2020 Discharge date: 12/31/2020 Admission Diagnoses:  Active Problems:   Cervical stenosis of spine   Discharge Diagnoses:  Active Problems:   Cervical stenosis of spine  status post Procedure(s): ANTERIOR CERVICAL DISCECTOMY FUSION C4-5. C5-6, allograft, plate  Past Medical History:  Diagnosis Date  . Allergy    seasonal allergies  . Anxiety    on meds  . Arthritis    back  . Cataract    bilateral   . Depression    on meds  . Diverticulosis   . Emphysema of lung ([place])    uses inhaler  . History of BPH   . Hyperlipidemia    MIXED--on meds  . Hyperplastic colonic polyp   . Hypertension    UNSPE  . Hypertension, benign    on meds  . Hypertension, essential   . Tobacco use disorder     Surgeries: Procedure(s): ANTERIOR CERVICAL DISCECTOMY FUSION C4-5. C5-6, allograft, plate on 9/56/2130   Consultants:   Discharged Condition: Improved  Hospital Course: Johnny Brown is an 75 y.o. male who was admitted 12/30/2020 for operative treatment of cervical stenosis/HNP. Patient failed conservative treatments (please see the history and physical for the specifics) and had severe unremitting pain that affects sleep, daily activities and work/hobbies. After pre-op clearance, the patient was taken to the operating room on 12/30/2020 and underwent  Procedure(s): ANTERIOR CERVICAL DISCECTOMY FUSION C4-5. C5-6, allograft, plate.    Patient was given perioperative antibiotics:  Anti-infectives (From admission, onward)   Start     Dose/Rate Route Frequency Ordered Stop   12/30/20 0600  ceFAZolin (ANCEF) IVPB 2g/100 mL premix        2 g 200 mL/hr over 30 Minutes Intravenous On call to O.R. 12/30/20 0558 12/30/20 0830       Patient was given sequential compression devices and early ambulation to prevent DVT.   Patient benefited maximally from hospital stay and there were no complications.  At the time of discharge, the patient was urinating/moving their bowels without difficulty, tolerating a regular diet, pain is controlled with oral pain medications and they have been cleared by PT/OT.   Recent vital signs: No data found.   Recent laboratory studies: No results for input(s): WBC, HGB, HCT, PLT, NA, K, CL, CO2, BUN, CREATININE, GLUCOSE, INR, CALCIUM in the last 72 hours.  Invalid input(s): PT, 2   Discharge Medications:   Allergies as of 12/31/2020      Reactions   Lisinopril    cough      Medication List    STOP taking these medications   acetaminophen 650 MG CR tablet Commonly known as: TYLENOL   meloxicam 15 MG tablet Commonly known as: MOBIC     TAKE these medications   albuterol 108 (90 Base) MCG/ACT inhaler Commonly known as: VENTOLIN HFA Inhale 2 puffs into the lungs every 6 (six) hours as needed for wheezing or shortness of breath.   amLODipine 5 MG tablet Commonly known as: NORVASC Take 1 tablet (5 mg total) by mouth daily.   buPROPion 150 MG 24 hr tablet Commonly known as: WELLBUTRIN XL Take 150 mg by mouth daily.   cetirizine 10 MG tablet Commonly known as: ZYRTEC Take 10 mg by mouth daily.   doxazosin 4 MG tablet Commonly known as: CARDURA Take 4 mg by mouth daily.   FLUoxetine 40 MG capsule Commonly known as: PROZAC Take 40 mg by mouth  daily.   gabapentin 300 MG capsule Commonly known as: NEURONTIN Take 1 capsule (300 mg total) by mouth 3 (three) times daily. 3 times a day when necessary neuropathy pain What changed:   when to take this  additional instructions   losartan-hydrochlorothiazide 100-25 MG tablet Commonly known as: HYZAAR Take 1 tablet by mouth daily.   methocarbamol 500 MG tablet Commonly known as: Robaxin Take 1 tablet (500 mg total) by mouth every 6 (six) hours as needed for muscle spasms.   oxyCODONE-acetaminophen 5-325 MG tablet Commonly known as: PERCOCET/ROXICET Take 1 tablet by mouth every 4 (four)  hours as needed for severe pain.   PROBIOTIC DAILY PO Take 1 capsule by mouth daily.   simvastatin 20 MG tablet Commonly known as: ZOCOR Take 20 mg by mouth daily at 6 PM.   tiotropium 18 MCG inhalation capsule Commonly known as: SPIRIVA Place 18 mcg into inhaler and inhale daily.   Tiotropium Bromide-Olodaterol 2.5-2.5 MCG/ACT Aers Inhale 2 puffs into the lungs daily.       Diagnostic Studies: DG Cervical Spine 2 or 3 views  Result Date: 12/30/2020 CLINICAL DATA:  Surgery, elective. Additional history provided: Anterior cervical discectomy fusion C4-5. C5-6, allograft, plate. Provided fluoroscopy time 19 seconds (6.32 mGy). EXAM: CERVICAL SPINE - 2-3 VIEW; DG C-ARM 1-60 MIN COMPARISON:  Cervical spine MRI 12/01/2020. FINDINGS: PA and lateral view intraoperative fluoroscopic images of the cervical spine are submitted, 3 images total. On the provided images, ACDF hardware (ventral plate and screws) is present at C4-C6. Interbody devices are also present at C4-C5 and C5-C6. No unexpected finding on the provided views. Partially visualized ET tube. IMPRESSION: Three intraoperative fluoroscopic images from C4-C6 ACDF, as described. Electronically Signed   By: Kellie Simmering DO   On: 12/30/2020 10:17   DG C-Arm 1-60 Min  Result Date: 12/30/2020 CLINICAL DATA:  Surgery, elective. Additional history provided: Anterior cervical discectomy fusion C4-5. C5-6, allograft, plate. Provided fluoroscopy time 19 seconds (6.32 mGy). EXAM: CERVICAL SPINE - 2-3 VIEW; DG C-ARM 1-60 MIN COMPARISON:  Cervical spine MRI 12/01/2020. FINDINGS: PA and lateral view intraoperative fluoroscopic images of the cervical spine are submitted, 3 images total. On the provided images, ACDF hardware (ventral plate and screws) is present at C4-C6. Interbody devices are also present at C4-C5 and C5-C6. No unexpected finding on the provided views. Partially visualized ET tube. IMPRESSION: Three intraoperative fluoroscopic images from  C4-C6 ACDF, as described. Electronically Signed   By: Kellie Simmering DO   On: 12/30/2020 10:17    Discharge Instructions    Incentive spirometry RT   Complete by: As directed        Follow-up Information    Schedule an appointment as soon as possible for a visit with [name], MD.   Specialty: Orthopedic Surgery Why: need return office with Dr Lorin Mercy 2 weeks postop Contact information: [place] [address] [phone]               Discharge Plan:  discharge to home  Disposition:     Signed: Benjiman Core  01/10/2021, 9:44 AM

## 2021-01-11 ENCOUNTER — Encounter: Payer: Self-pay | Admitting: Orthopaedic Surgery

## 2021-01-11 ENCOUNTER — Ambulatory Visit (INDEPENDENT_AMBULATORY_CARE_PROVIDER_SITE_OTHER): Payer: Medicare PPO | Admitting: Orthopaedic Surgery

## 2021-01-11 ENCOUNTER — Ambulatory Visit (INDEPENDENT_AMBULATORY_CARE_PROVIDER_SITE_OTHER): Payer: Medicare PPO

## 2021-01-11 VITALS — BP 119/68 | HR 67

## 2021-01-11 DIAGNOSIS — M542 Cervicalgia: Secondary | ICD-10-CM | POA: Diagnosis not present

## 2021-01-11 DIAGNOSIS — Z981 Arthrodesis status: Secondary | ICD-10-CM

## 2021-01-11 MED ORDER — TRAMADOL HCL 50 MG PO TABS: 50.0000 mg | ORAL_TABLET | Freq: Four times a day (QID) | ORAL | 0 refills | Status: AC | PRN

## 2021-01-11 NOTE — Progress Notes (Signed)
Post-Op Visit Note   Patient: Johnny Brown           Date of Birth: 02/01/1946           MRN: [id number] Visit Date: 01/11/2021 PCP: Josem Kaufmann, MD   Assessment & Plan: Postop C4-5 C5-6 anterior cervical discectomy and fusion on 12/30/2020.  Incision looks good Steri-Strips changed.  Chief Complaint:  Chief Complaint  Patient presents with  . Neck - Routine Post Op   Visit Diagnoses:  1. Neck pain     Plan: Collar that was given to him was somehow measured and was too short and could not fasten.  New universal collar given so he can use for showering.  Return in 5 weeks for lateral flexion-extension C-spine x-rays.  New collar coverage given.  Ultram sent in for pain.  Follow-Up Instructions: No follow-ups on file.   Orders:  Orders Placed This Encounter  Procedures  . XR Cervical Spine 2 or 3 views   No orders of the defined types were placed in this encounter.   Imaging: No results found.  PMFS History: Patient Active Problem List   Diagnosis Date Noted  . Cervical stenosis of spine 12/30/2020  . 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 HTN (hypertension) 04/05/2020  . HLD (hyperlipidemia) 04/05/2020  . Change in bowel habit 05/05/2019  . Abdominal swelling 05/05/2019  . Abdominal bloating 05/05/2019  . Other constipation 05/05/2019  . Dyspnea 03/22/2011  . Pipe smoker 03/22/2011  . Foot pain 03/22/2011   Past Medical History:  Diagnosis Date  . Allergy    seasonal allergies  . Anxiety    on meds  . Arthritis    back  . Cataract    bilateral   . Depression    on meds  . Diverticulosis   . Emphysema of lung ([place])    uses inhaler  . History of BPH   . Hyperlipidemia    MIXED--on meds  . Hyperplastic colonic polyp   . Hypertension    UNSPE  . Hypertension, benign    on meds  . Hypertension, essential   . Tobacco use disorder     Family History  Problem Relation Age of Onset  . Lung cancer Father   . Heart attack Father    . Liver cancer Brother   . Colon cancer Paternal Uncle   . Colon polyps Son   . Esophageal cancer Neg Hx   . Rectal cancer Neg Hx   . Stomach cancer Neg Hx     Past Surgical History:  Procedure Laterality Date  . ANTERIOR CERVICAL DECOMP/DISCECTOMY FUSION N/A 12/30/2020   Procedure: ANTERIOR CERVICAL DISCECTOMY FUSION C4-5. C5-6, allograft, plate;  Surgeon: [name], MD;  Location: [facility];  Service: Orthopedics;  Laterality: N/A;  . CATARACT EXTRACTION, BILATERAL    . COLONOSCOPY  2020   JMP-MAC-good prep=TICS/hems/multiple TA's  . COLONOSCOPY    . EYE SURGERY     Cataract Surgery Both Eyes  . POLYPECTOMY  2020   mulitple  . ROTATOR CUFF REPAIR Right 2003  . VASECTOMY     Social History   Occupational History  . Not on file  Tobacco Use  . Smoking status: Former Smoker    Packs/day: 1.00    Years: 61.00    Pack years: 61.00    Types: Pipe, Cigarettes    Quit date: 02/02/2019    Years since quitting: 1.9  . Smokeless tobacco: Never Used  Vaping Use  .  Vaping Use: Never used  Substance and Sexual Activity  . Alcohol use: No  . Drug use: Never  . Sexual activity: Not on file

## 2021-01-13 ENCOUNTER — Inpatient Hospital Stay: Payer: Medicare PPO | Admitting: Orthopaedic Surgery

## 2021-02-17 ENCOUNTER — Ambulatory Visit (INDEPENDENT_AMBULATORY_CARE_PROVIDER_SITE_OTHER): Payer: Medicare PPO | Admitting: Orthopaedic Surgery

## 2021-02-17 ENCOUNTER — Other Ambulatory Visit: Payer: Self-pay

## 2021-02-17 ENCOUNTER — Ambulatory Visit (INDEPENDENT_AMBULATORY_CARE_PROVIDER_SITE_OTHER): Payer: Medicare PPO

## 2021-02-17 ENCOUNTER — Encounter: Payer: Self-pay | Admitting: Orthopaedic Surgery

## 2021-02-17 VITALS — BP 122/66 | HR 70 | Ht 69.0 in | Wt 227.0 lb

## 2021-02-17 DIAGNOSIS — Z981 Arthrodesis status: Secondary | ICD-10-CM

## 2021-02-17 NOTE — Progress Notes (Signed)
Post-Op Visit Note   Patient: Johnny Brown           Date of Birth: March 12, 1946           MRN: [id number] Visit Date: 02/17/2021 PCP: Josem Kaufmann, MD   Assessment & Plan:rov 2 mo  Chief Complaint:  Chief Complaint  Patient presents with   Neck - Follow-up    12/30/2020 C4-5, C5-6 ACDF   Visit Diagnoses:  1. S/P cervical spinal fusion     Plan: Follow-up two-level cervical fusion with myelopathic gait problems and falling preop.  Patient has noticed improvement in his gait hands doing better.  Pain patient return 2 months repeat lateral flexion-extension cervical spine x-rays at that time.  We discussed avoiding heavy lifting and continue walking program.  Follow-Up Instructions: No follow-ups on file.   Orders:  Orders Placed This Encounter  Procedures   XR Cervical Spine 2 or 3 views   No orders of the defined types were placed in this encounter.   Imaging: No results found.  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   HTN (hypertension) 04/05/2020   HLD (hyperlipidemia) 04/05/2020   Change in bowel habit 05/05/2019   Abdominal swelling 05/05/2019   Abdominal bloating 05/05/2019   Other constipation 05/05/2019   Dyspnea 03/22/2011   Pipe smoker 03/22/2011   Foot pain 03/22/2011   Past Medical History:  Diagnosis Date   Allergy    seasonal allergies   Anxiety    on meds   Arthritis    back   Cataract    bilateral    Depression    on meds   Diverticulosis    Emphysema of lung ([facility])    uses inhaler   History of BPH    Hyperlipidemia    MIXED--on meds   Hyperplastic colonic polyp    Hypertension    UNSPE   Hypertension, benign    on meds   Hypertension, essential    Tobacco use disorder     Family History  Problem Relation Age of Onset   Lung cancer Father    Heart attack Father    Liver cancer Brother    Colon cancer  Paternal Uncle    Colon polyps Son    Esophageal cancer Neg Hx    Rectal cancer Neg Hx    Stomach cancer Neg Hx     Past Surgical History:  Procedure Laterality Date   ANTERIOR CERVICAL DECOMP/DISCECTOMY FUSION N/A 12/30/2020   Procedure: ANTERIOR CERVICAL DISCECTOMY FUSION C4-5. C5-6, allograft, plate;  Surgeon: [name], MD;  Location: [facility];  Service: Orthopedics;  Laterality: N/A;   CATARACT EXTRACTION, BILATERAL     COLONOSCOPY  2020   JMP-MAC-good prep=TICS/hems/multiple TA's   COLONOSCOPY     EYE SURGERY     Cataract Surgery Both Eyes   POLYPECTOMY  2020   mulitple   ROTATOR CUFF REPAIR Right 2003   VASECTOMY     Social History   Occupational History   Not on file  Tobacco Use   Smoking status: Former    Packs/day: 1.00    Years: 61.00    Pack years: 61.00    Types: Pipe, Cigarettes    Quit date: 02/02/2019    Years since quitting: 2.0   Smokeless tobacco: Never  Vaping Use   Vaping Use: Never used  Substance and Sexual Activity  Alcohol use: No   Drug use: Never   Sexual activity: Not on file

## 2021-05-16 ENCOUNTER — Ambulatory Visit (INDEPENDENT_AMBULATORY_CARE_PROVIDER_SITE_OTHER): Payer: Medicare PPO | Admitting: Orthopaedic Surgery

## 2021-05-16 ENCOUNTER — Encounter: Payer: Self-pay | Admitting: Orthopaedic Surgery

## 2021-05-16 ENCOUNTER — Ambulatory Visit: Payer: Self-pay

## 2021-05-16 ENCOUNTER — Other Ambulatory Visit: Payer: Self-pay

## 2021-05-16 VITALS — BP 134/66 | HR 65 | Ht 69.0 in | Wt 226.0 lb

## 2021-05-16 DIAGNOSIS — M545 Low back pain, unspecified: Secondary | ICD-10-CM

## 2021-05-16 DIAGNOSIS — G8929 Other chronic pain: Secondary | ICD-10-CM

## 2021-05-16 DIAGNOSIS — Z981 Arthrodesis status: Secondary | ICD-10-CM | POA: Diagnosis not present

## 2021-05-16 NOTE — Progress Notes (Signed)
Office Visit Note   Patient: Johnny Brown           Date of Birth: 1945/12/03           MRN: [id number] Visit Date: 05/16/2021              Requested by: Josem Kaufmann, MD [address] [place] [address] PCP: Josem Kaufmann, MD   Assessment & Plan: Visit Diagnoses:  1. S/P cervical spinal fusion   2. Chronic bilateral low back pain, unspecified whether sciatica present     Plan: We discussed ordering therapy and wife states she like to go back to the gym with him since they both need to get back working out.  Recheck 6 months.  Follow-Up Instructions: No follow-ups on file.   Orders:  Orders Placed This Encounter  Procedures   XR Cervical Spine 2 or 3 views   XR Lumbar Spine 2-3 Views   No orders of the defined types were placed in this encounter.     Procedures: No procedures performed   Clinical Data: No additional findings.   Subjective: Chief Complaint  Patient presents with   Neck - Follow-up    12/30/2020 C4-5, C5-6 ACDF    HPI patient turns post two-level cervical fusion.  He had posterior ligamentous calcification from a old injury.  He has been on his 4 wheeler sometimes after [place] 4 wheel[place] he notes his neck seems a little bit sore.  Continues to have some low back pain off and on and he had some moderate bilateral foraminal narrowing at L4-5 and mild central narrowing at 5 1 with a right foraminal disc but is not having any right leg pain currently.  Has problems when he lays on his left side at night but can lay on his right side.  He has not been to the gym since his April surgery.  Wife is with him today and adds that they need to get back to the gym.  Patient has noticed problems with his legs going both up and down hills with some weakness.  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  Review of Systems updated unchanged   Objective: Vital Signs: BP 134/66   Pulse 65   Ht _0  (1.753 m)   Wt 226 lb (102.5 kg)   BMI 33.37 kg/m   Physical Exam Constitutional:      Appearance: He is well-developed.  HENT:     Head: Normocephalic and atraumatic.     Right Ear: External ear normal.     Left Ear: External ear normal.  Eyes:     Pupils: Pupils are equal, round, and reactive to light.  Neck:     Thyroid: No thyromegaly.     Trachea: No tracheal deviation.  Cardiovascular:     Rate and Rhythm: Normal rate.  Pulmonary:     Effort: Pulmonary effort is normal.     Breath sounds: No wheezing.  Abdominal:     General: Bowel sounds are normal.     Palpations: Abdomen is soft.  Musculoskeletal:     Cervical back: Neck supple.  Skin:    General: Skin is warm and dry.     Capillary Refill: Capillary refill takes less than 2 seconds.  Neurological:     Mental Status: He  is alert and oriented to person, place, and time.  Psychiatric:        Behavior: Behavior normal.        Thought Content: Thought content normal.        Judgment: Judgment normal.    Ortho Exam normal heel to gait.  Negative logroll the hips.  Specialty Comments:  No specialty comments available.  Imaging: No results found.   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   HTN (hypertension) 04/05/2020   HLD (hyperlipidemia) 04/05/2020   Change in bowel habit 05/05/2019   Abdominal swelling 05/05/2019   Abdominal bloating 05/05/2019   Other constipation 05/05/2019   Dyspnea 03/22/2011   Pipe smoker 03/22/2011   Foot pain 03/22/2011   Past Medical History:  Diagnosis Date   Allergy    seasonal allergies   Anxiety    on meds   Arthritis    back   Cataract    bilateral    Depression    on meds   Diverticulosis    Emphysema of lung (HCC)    uses  inhaler   History of BPH    Hyperlipidemia    MIXED--on meds   Hyperplastic colonic polyp    Hypertension    UNSPE   Hypertension, benign    on meds   Hypertension, essential    Tobacco use disorder     Family History  Problem Relation Age of Onset   Lung cancer Father    Heart attack Father    Liver cancer Brother    Colon cancer Paternal Uncle    Colon polyps Son    Esophageal cancer Neg Hx    Rectal cancer Neg Hx    Stomach cancer Neg Hx     Past Surgical History:  Procedure Laterality Date   ANTERIOR CERVICAL DECOMP/DISCECTOMY FUSION N/A 12/30/2020   Procedure: ANTERIOR CERVICAL DISCECTOMY FUSION C4-5. C5-6, allograft, plate;  Surgeon: [name], MD;  Location: [facility];  Service: Orthopedics;  Laterality: N/A;   CATARACT EXTRACTION, BILATERAL     COLONOSCOPY  2020   JMP-MAC-good prep=TICS/hems/multiple TA's   COLONOSCOPY     EYE SURGERY     Cataract Surgery Both Eyes   POLYPECTOMY  2020   mulitple   ROTATOR CUFF REPAIR Right 2003   VASECTOMY     Social History   Occupational History   Not on file  Tobacco Use   Smoking status: Former    Packs/day: 1.00    Years: 61.00    Pack years: 61.00    Types: Pipe, Cigarettes    Quit date: 02/02/2019    Years since quitting: 2.2   Smokeless tobacco: Never  Vaping Use   Vaping Use: Never used  Substance and Sexual Activity   Alcohol use: No   Drug use: Never   Sexual activity: Not on file

## 2022-01-04 ENCOUNTER — Encounter: Payer: Self-pay | Admitting: Cardiovascular Disease

## 2022-01-04 NOTE — Progress Notes (Signed)
?Cardiology Office Note:   ? ?Date:  01/05/2022  ? ?ID:  AHMAAD NEIDHARDT, DOB 05/31/1946, MRN [id number] ? ?PCP:  Tempie Hoist, FNP  ?Cardiologist:   Larrisa Cravey   ? ?Referring MD: Josem Kaufmann, MD  ? ?Chief Complaint  ?Patient presents with  ? Shortness of Breath  ? ?Problem List ?1. Dyspnea ?2. Essential HTN ?3, back pain  ? ? ?   ? ?Johnny Brown is a 76 y.o. male with a hx of DOE .     Ive seen him for CP in the past.   myoview was normal. ? ?Now has had several months of DOE.   Has significant DOE walking up from his dog lot.  No DOE carrying groceries in from the car  ?Has not been exercising  ?No CP or tightness.   No syncope  ?Retired from truck driving .  ? ?Has had HTN for years .   Takes his meds regularly .    ?Still smokes  A pipe .   Advised him to stop  ? ?Jan 20, 2018: ? ?Ah is seen today for follow-up of his shortness of breath and hypertension. ?Echocardiogram in January, 2019 reveals normal left ventricular systolic function.  He has grade 1 diastolic dysfunction. ?Stress Myoview study revealed an ejection fraction of 54%.  There was no evidence of ischemia.  It was a low risk study. ? ?Not getting much exercise,   Has gained some weight.   ?Looking forward to exercising - he and his wife are going to join a gym soon.  ?BP has been ok.   ?Still eats salty foods  ? ?April 05, 2020:  ?Johnny Brown is seen today for follow-up visit.  He is a 77 year old gentleman with a history of shortness of breath with exertion.  Myoview study performed in 2021 was low risk.  Echocardiogram in January, 2019 reveals normal left ventricular systolic function.  He has grade 1 diastolic dysfunction.    ? ?He stopped smoking a year ago .  ?Still short of breath.  Not much exercise  ? ?Jan 05, 2022 ?Johnny Brown is seen for follow up visit ?Hx of dyspnea - normal myoview study  ?Doing well, ?Exercising occasionally  ? ?Has lots of spinal issues , ?Has leg pain sciatica like pain ?Symptoms sound like nerve pai  ? ?HbA1C is  elevated ? ? ?On Simvastatin 20 mg a day  ?We discussed CAC score ?He does not want to get a CAC scan  ?We do not have him on any meds. ?He will follow up with his primary MD and will see [place] as needed ?Past Medical History:  ?Diagnosis Date  ? Allergy   ? seasonal allergies  ? Anxiety   ? on meds  ? Arthritis   ? back  ? Cataract   ? bilateral   ? Depression   ? on meds  ? Diverticulosis   ? Emphysema of lung ([facility])   ? uses inhaler  ? History of BPH   ? Hyperlipidemia   ? MIXED--on meds  ? Hyperplastic colonic polyp   ? Hypertension   ? UNSPE  ? Hypertension, benign   ? on meds  ? Hypertension, essential   ? Tobacco use disorder   ? ? ?Past Surgical History:  ?Procedure Laterality Date  ? ANTERIOR CERVICAL DECOMP/DISCECTOMY FUSION N/A 12/30/2020  ? Procedure: ANTERIOR CERVICAL DISCECTOMY FUSION C4-5. C5-6, allograft, plate;  Surgeon: [name], MD;  Location: [facility];  Service: Orthopedics;  Laterality: N/A;  ? CATARACT EXTRACTION, BILATERAL    ? COLONOSCOPY  2020  ? JMP-MAC-good prep=TICS/hems/multiple TA's  ? COLONOSCOPY    ? EYE SURGERY    ? Cataract Surgery Both Eyes  ? POLYPECTOMY  2020  ? mulitple  ? ROTATOR CUFF REPAIR Right 2003  ? VASECTOMY    ? ? ?Current Medications: ?Current Meds  ?Medication Sig  ? albuterol (VENTOLIN HFA) 108 (90 Base) MCG/ACT inhaler Inhale 2 puffs into the lungs every 6 (six) hours as needed for wheezing or shortness of breath.  ? amLODipine (NORVASC) 5 MG tablet Take 1 tablet (5 mg total) by mouth daily.  ? cetirizine (ZYRTEC) 10 MG tablet Take 10 mg by mouth daily.  ? doxazosin (CARDURA) 4 MG tablet Take 4 mg by mouth daily.  ? FLUoxetine (PROZAC) 40 MG capsule Take 40 mg by mouth daily.  ? losartan-hydrochlorothiazide (HYZAAR) 100-25 MG tablet Take 1 tablet by mouth daily.   ? simvastatin (ZOCOR) 20 MG tablet Take 20 mg by mouth daily at 6 PM.   ? ?Current Facility-Administered Medications for the 01/05/22 encounter (Office Visit) with Butch Otterson, Wonda Cheng, MD  ?Medication  ? 0.9 %   sodium chloride infusion  ?  ? ?Allergies:   Lisinopril  ? ?Social History  ? ?Socioeconomic History  ? Marital status: Married  ?  Spouse name: Not on file  ? Number of children: Not on file  ? Years of education: Not on file  ? Highest education level: Not on file  ?Occupational History  ? Not on file  ?Tobacco Use  ? Smoking status: Former  ?  Packs/day: 1.00  ?  Years: 61.00  ?  Pack years: 61.00  ?  Types: Pipe, Cigarettes  ?  Quit date: 02/02/2019  ?  Years since quitting: 2.9  ? Smokeless tobacco: Never  ?Vaping Use  ? Vaping Use: Never used  ?Substance and Sexual Activity  ? Alcohol use: No  ? Drug use: Never  ? Sexual activity: Not on file  ?Other Topics Concern  ? Not on file  ?Social History Narrative  ? Not on file  ? ?Social Determinants of Health  ? ?Financial Resource Strain: Not on file  ?Food Insecurity: Not on file  ?Transportation Needs: Not on file  ?Physical Activity: Not on file  ?Stress: Not on file  ?Social Connections: Not on file  ?  ? ?Family History: ?The patient's family history includes Colon cancer in his paternal uncle; Colon polyps in his son; Heart attack in his father; Liver cancer in his brother; Lung cancer in his father. There is no history of Esophageal cancer, Rectal cancer, or Stomach cancer. ? ?ROS:   ?Please see the history of present illness.    ? All other systems reviewed and are negative. ? ?EKGs/Labs/Other Studies Reviewed:   ? ?The following studies were reviewed today: ? ? ? ? ?Recent Labs: ?No results found for requested labs within last 8760 hours.  ?Recent Lipid Panel ?No results found for: CHOL, TRIG, HDL, CHOLHDL, VLDL, LDLCALC, LDLDIRECT ? ?Physical Exam:   ? ? ?Physical Exam: ?Blood pressure 130/62, pulse 74, height _0  (1.753 m), weight 223 lb 9.6 oz (101.4 kg), SpO2 96 %. ? ?GEN:  Well nourished, moderately obese male,  in no acute distress ?HEENT: Normal ?NECK: No JVD; No carotid bruits ?LYMPHATICS: No lymphadenopathy ?CARDIAC: RRR , no murmurs, rubs,  gallops ?RESPIRATORY:  Clear to auscultation without rales, wheezing or rhonchi  ?ABDOMEN: Soft, non-tender, non-distended ?MUSCULOSKELETAL:  No edema; No deformity  ?SKIN: Warm and dry ?NEUROLOGIC:  Alert and oriented x 3 ? ? ?EKG:    Jan 05, 2022. NSR at 74.  RAD. Borderline QT prolongation  ? ? ?ASSESSMENT:   ? ?1. Essential hypertension   ?2. Mixed hyperlipidemia   ? ? ?PLAN:   ? ?  ?1.  Dysnpea:    ?His breathing is stable.   No CP  ?Myoview was normal  ?.  ? ?2.  Essential hypertension:  -   ?BP is well controlled.  ? ? ?3.  Hyperlipidemia: His hypertension is managed by his primary medical doctor. ? ?He will see [place] on an as-needed basis. ? ? ?Medication Adjustments/Labs and Tests Ordered: ?Current medicines are reviewed at length with the patient today.  Concerns regarding medicines are outlined above.  ?Orders Placed This Encounter  ?Procedures  ? EKG 12-Lead  ? ?No orders of the defined types were placed in this encounter. ? ? ?Signed, ?Mertie Moores, MD  ?01/05/2022 5:16 PM    ?[place] ? ?

## 2022-01-05 ENCOUNTER — Ambulatory Visit: Payer: Medicare PPO | Admitting: Cardiovascular Disease

## 2022-01-05 ENCOUNTER — Encounter: Payer: Self-pay | Admitting: Cardiovascular Disease

## 2022-01-05 VITALS — BP 130/62 | HR 74 | Ht 69.0 in | Wt 223.6 lb

## 2022-01-05 DIAGNOSIS — E782 Mixed hyperlipidemia: Secondary | ICD-10-CM | POA: Diagnosis not present

## 2022-01-05 DIAGNOSIS — I10 Essential (primary) hypertension: Secondary | ICD-10-CM

## 2022-01-05 NOTE — Patient Instructions (Signed)
Medication Instructions:  ?Your physician recommends that you continue on your current medications as directed. Please refer to the Current Medication list given to you today. ? ?*If you need a refill on your cardiac medications before your next appointment, please call your pharmacy* ? ? ?Lab Work: ?NONE ?If you have labs (blood work) drawn today and your tests are completely normal, you will receive your results only by: ?MyChart Message (if you have MyChart) OR ?A paper copy in the mail ?If you have any lab test that is abnormal or we need to change your treatment, we will call you to review the results. ? ? ?Testing/Procedures: ?NONE ? ? ?Follow-Up: ?At [facility], you and your health needs are our priority.  As part of our continuing mission to provide you with exceptional heart care, we have created designated Provider Care Teams.  These Care Teams include your primary Cardiologist (physician) and Advanced Practice Providers (APPs -  Physician Assistants and Nurse Practitioners) who all work together to provide you with the care you need, when you need it. ? ?We recommend signing up for the patient portal called "MyChart".  Sign up information is provided on this After Visit Summary.  MyChart is used to connect with patients for Virtual Visits (Telemedicine).  Patients are able to view lab/test results, encounter notes, upcoming appointments, etc.  Non-urgent messages can be sent to your provider as well.   ?To learn more about what you can do with MyChart, go to [name].   ? ?Your next appointment:   ?As Needed ? ?Provider:   ?Mertie Moores, MD   ? ?  ? ?Important Information About Sugar ? ? ? ? ?  ?

## 2022-02-21 ENCOUNTER — Ambulatory Visit: Payer: Medicare PPO | Admitting: Orthopaedic Surgery

## 2022-02-21 ENCOUNTER — Ambulatory Visit (INDEPENDENT_AMBULATORY_CARE_PROVIDER_SITE_OTHER): Payer: Medicare PPO

## 2022-02-21 VITALS — BP 120/65 | HR 63

## 2022-02-21 DIAGNOSIS — M7989 Other specified soft tissue disorders: Secondary | ICD-10-CM

## 2022-02-21 DIAGNOSIS — M79606 Pain in leg, unspecified: Secondary | ICD-10-CM | POA: Diagnosis not present

## 2022-02-21 DIAGNOSIS — M542 Cervicalgia: Secondary | ICD-10-CM

## 2022-02-23 ENCOUNTER — Other Ambulatory Visit: Payer: Self-pay

## 2022-02-23 DIAGNOSIS — M79606 Pain in leg, unspecified: Secondary | ICD-10-CM

## 2022-03-20 ENCOUNTER — Ambulatory Visit
Admission: RE | Admit: 2022-03-20 | Discharge: 2022-03-20 | Disposition: A | Discharge status: Home / Self Care | Payer: Medicare PPO | Source: Ambulatory Visit | Attending: Orthopaedic Surgery | Admitting: Orthopaedic Surgery

## 2022-03-20 ENCOUNTER — Ambulatory Visit (HOSPITAL_COMMUNITY)
Admission: RE | Admit: 2022-03-20 | Discharge: 2022-03-20 | Disposition: A | Discharge status: Home / Self Care | Payer: Medicare PPO | Source: Ambulatory Visit | Attending: Orthopaedic Surgery | Admitting: Orthopaedic Surgery

## 2022-03-20 DIAGNOSIS — M7989 Other specified soft tissue disorders: Secondary | ICD-10-CM | POA: Insufficient documentation

## 2022-03-20 DIAGNOSIS — M542 Cervicalgia: Secondary | ICD-10-CM

## 2022-03-20 DIAGNOSIS — M79606 Pain in leg, unspecified: Secondary | ICD-10-CM | POA: Diagnosis present

## 2022-03-20 NOTE — Progress Notes (Signed)
Bilateral lower extremity venous duplex completed. Refer to "CV Proc" under chart review to view preliminary results.  03/20/2022 1:27 PM Kelby Aline., MHA, RVT, RDCS, RDMS

## 2022-03-27 ENCOUNTER — Ambulatory Visit: Payer: Medicare PPO | Admitting: Orthopaedic Surgery

## 2022-03-27 ENCOUNTER — Encounter: Payer: Self-pay | Admitting: Orthopaedic Surgery

## 2022-03-27 VITALS — BP 105/58 | HR 66 | Ht 69.0 in | Wt 223.0 lb

## 2022-03-27 DIAGNOSIS — Z981 Arthrodesis status: Secondary | ICD-10-CM | POA: Diagnosis not present

## 2022-03-27 DIAGNOSIS — S129XXD Fracture of neck, unspecified, subsequent encounter: Secondary | ICD-10-CM | POA: Diagnosis not present

## 2022-03-27 DIAGNOSIS — M542 Cervicalgia: Secondary | ICD-10-CM | POA: Diagnosis not present

## 2022-03-27 DIAGNOSIS — S129XXA Fracture of neck, unspecified, initial encounter: Secondary | ICD-10-CM | POA: Insufficient documentation

## 2022-03-27 NOTE — Progress Notes (Signed)
Office Visit Note   Patient: Johnny Brown           Date of Birth: [birth year]           MRN: [id number] Visit Date: 03/27/2022              Requested by: Tempie Hoist, FNP [address] [place],  [place] [address] PCP: Tempie Hoist, FNP   Assessment & Plan: Visit Diagnoses:  1. Neck pain   2. S/P cervical spinal fusion   3. Pseudoarthrosis of cervical spine, subsequent encounter   4.     Balance problems   Plan: Patient had fall last month.  We will set him up for some physical therapy at [facility] in [place].  Evaluation and treatment for decreased balance and history of falls.  He is mostly followed when he is going down the steps he does not have a rail . He will work on getting a rail put in at his house since he is fallen 2 times on the steps.  Lumbar MRI 12/01/2020 did not show any stenosis problems.  We will check him back in 2 months.  If his cervical symptoms progress and single level posterior fusion at C5-6 was discussed.  Follow-Up Instructions: Return in about 2 months (around 05/28/2022).   Orders:  Orders Placed This Encounter  Procedures   Ambulatory referral to Physical Therapy   No orders of the defined types were placed in this encounter.     Procedures: No procedures performed   Clinical Data: No additional findings.   Subjective: Chief Complaint  Patient presents with   Neck - Pain, Follow-up    HPI  Review of Systems   Objective: Vital Signs: BP (!) 105/58   Pulse 66   Ht _0  (1.753 m)   Wt 223 lb (101.2 kg)   BMI 32.93 kg/m   Physical Exam  Ortho Exam  Specialty Comments:  No specialty comments available.  Imaging: Study Result  Narrative & Impression  CLINICAL DATA:  Cervical radiculopathy, prior cervical surgery C4-5 C5-6 ACDF ,? Pseudoarthrosis   EXAM: CT CERVICAL SPINE WITHOUT CONTRAST   TECHNIQUE: Multidetector CT imaging of the cervical spine was performed without intravenous contrast.  Multiplanar CT image reconstructions were also generated.   RADIATION DOSE REDUCTION: This exam was performed according to the departmental dose-optimization program which includes automated exposure control, adjustment of the mA and/or kV according to patient size and/or use of iterative reconstruction technique.   COMPARISON:  None Available.   FINDINGS: Alignment: Straightening.  No substantial sagittal subluxation.   Skull base and vertebrae: Vertebral body heights are maintained. No evidence of acute fracture.   Soft tissues and spinal canal: No prevertebral fluid or swelling. No visible canal hematoma.   Disc levels: C4-C6 ACDF. There is some bony fusion across the C4-C5 disc space, but no convincing bony fusion across the C5-C6 disc space. Hardware appears intact. No lucency around the screws to suggest loosening. Multilevel facet and uncovertebral hypertrophy with varying degrees of neural foraminal stenosis, probably greatest on the right at C3-C4 and C4-C5. Endplate spurring contributes to probably mild to moderate canal stenosis at the levels of fusion without evidence of high-grade bony canal stenosis.   Upper chest: Emphysema in the visualized lung apices. Calcified granuloma in the left upper lobe, partially imaged.   IMPRESSION: 1. C4-C6 ACDF. There is some bony fusion across the C4-C5 disc space, but no convincing bony fusion across the C5-C6  disc space. 2. Multilevel foraminal stenosis, probably greatest on the right at C3-C4 and C4-C5. No high-grade bony canal stenosis. MRI could better assess the canal/cord/foramina if clinically warranted.     Electronically Signed   By: Margaretha Sheffield M.D.     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   HTN (hypertension) 04/05/2020   HLD  (hyperlipidemia) 04/05/2020   Abdominal swelling 05/05/2019   Abdominal bloating 05/05/2019   Other constipation 05/05/2019   Dyspnea 03/22/2011   Pipe smoker 03/22/2011   Foot pain 03/22/2011   Past Medical History:  Diagnosis Date   Allergy    seasonal allergies   Anxiety    on meds   Arthritis    back   Cataract    bilateral    Depression    on meds   Diverticulosis    Emphysema of lung (HCC)    uses inhaler   History of BPH    Hyperlipidemia    MIXED--on meds   Hyperplastic colonic polyp    Hypertension    UNSPE   Hypertension, benign    on meds   Hypertension, essential    Tobacco use disorder     Family History  Problem Relation Age of Onset   Lung cancer Father    Heart attack Father    Liver cancer Brother    Colon cancer Paternal Uncle    Colon polyps Son    Esophageal cancer Neg Hx    Rectal cancer Neg Hx    Stomach cancer Neg Hx     Past Surgical History:  Procedure Laterality Date   ANTERIOR CERVICAL DECOMP/DISCECTOMY FUSION N/A 12/30/2020   Procedure: ANTERIOR CERVICAL DISCECTOMY FUSION C4-5. C5-6, allograft, plate;  Surgeon: [name], MD;  Location: [facility];  Service: Orthopedics;  Laterality: N/A;   CATARACT EXTRACTION, BILATERAL     COLONOSCOPY  2020   JMP-MAC-good prep=TICS/hems/multiple TA's   COLONOSCOPY     EYE SURGERY     Cataract Surgery Both Eyes   POLYPECTOMY  2020   mulitple   ROTATOR CUFF REPAIR Right 2003   VASECTOMY     Social History   Occupational History   Not on file  Tobacco Use   Smoking status: Former    Packs/day: 1.00    Years: 61.00    Total pack years: 61.00    Types: Pipe, Cigarettes    Quit date: 02/02/2019    Years since quitting: 3.1   Smokeless tobacco: Never  Vaping Use   Vaping Use: Never used  Substance and Sexual Activity   Alcohol use: No   Drug use: Never   Sexual activity: Not on file

## 2022-05-29 ENCOUNTER — Ambulatory Visit (INDEPENDENT_AMBULATORY_CARE_PROVIDER_SITE_OTHER): Payer: Medicare PPO

## 2022-05-29 ENCOUNTER — Ambulatory Visit: Payer: Medicare PPO | Admitting: Orthopaedic Surgery

## 2022-05-29 VITALS — BP 137/75 | HR 62 | Ht 69.0 in | Wt 223.0 lb

## 2022-05-29 DIAGNOSIS — G8929 Other chronic pain: Secondary | ICD-10-CM | POA: Diagnosis not present

## 2022-05-29 DIAGNOSIS — M25512 Pain in left shoulder: Secondary | ICD-10-CM | POA: Diagnosis not present

## 2022-05-29 MED ORDER — METHYLPREDNISOLONE ACETATE 40 MG/ML IJ SUSP
40.0000 mg | INTRAMUSCULAR | Status: AC | PRN
  Administered 2022-05-29: 40 mg via INTRA_ARTICULAR

## 2022-05-29 MED ORDER — LIDOCAINE HCL 1 % IJ SOLN
0.5000 mL | INTRAMUSCULAR | Status: AC | PRN
  Administered 2022-05-29: .5 mL

## 2022-05-29 MED ORDER — BUPIVACAINE HCL 0.25 % IJ SOLN
4.0000 mL | INTRAMUSCULAR | Status: AC | PRN
  Administered 2022-05-29: 4 mL via INTRA_ARTICULAR

## 2022-05-29 NOTE — Progress Notes (Signed)
e  Office Visit Note   Patient: Johnny Brown           Date of Birth: 04/13/1946           MRN: [id number] Visit Date: 05/29/2022              Requested by: Tempie Hoist, [place] [address] PCP: Tempie Hoist, FNP   Assessment & Plan: Visit Diagnoses:  1. Chronic left shoulder pain     Plan: Left subacromial injection performed which she tolerated well.  He will let us know if he has persistent symptoms we can consider diagnostic imaging.  Again we discussed the pseudoarthrosis and we can recheck in 1 to 2 months and repeat AP lateral cervical spine imaging on return.  Follow-Up Instructions: No follow-ups on file.   Orders:  Orders Placed This Encounter  Procedures   XR Shoulder Left   No orders of the defined types were placed in this encounter.     Procedures: Large Joint Inj: L subacromial bursa on 05/29/2022 12:34 PM Indications: pain Details: 22 G 1.5 in needle  Arthrogram: No  Medications: 4 mL bupivacaine 0.25 %; 40 mg methylPREDNISolone acetate 40 MG/ML; 0.5 mL lidocaine 1 % Outcome: tolerated well, no immediate complications Procedure, treatment alternatives, risks and benefits explained, specific risks discussed. Consent was given by the patient. Immediately prior to procedure a time out was called to verify the correct patient, procedure, equipment, support staff and site/side marked as required. Patient was prepped and draped in the usual sterile fashion.       Clinical Data: No additional findings.   Subjective: Chief Complaint  Patient presents with   Neck - Pain, Follow-up    HPI 76 year old male with going up the back door has a few steps with no railing and fell this is a second fall on the same steps.  Patient states he has had pain with abduction.  Previous cervical fusion with C4-5 level solid and C5-6 with pseudoarthrosis by CT scan.  Neck does not seem to be bothering him a whole lot currently.  He  has had previous surgery on his opposite right shoulder by Dr. Edyth Gunnels in the past.  Patient has pain in the left shoulder with getting dressed trying to wash his hair overhead activities ,putting on his shirt etc.  Review of Systems positive for hypertension cervical fusion cervical cervical pseudoarthrosis.  Previous shoulder surgery opposite right shoulder.  Negative for stroke.   Objective: Vital Signs: BP 137/75   Pulse 62   Ht _0  (1.753 m)   Wt 223 lb (101.2 kg)   BMI 32.93 kg/m   Physical Exam Constitutional:      Appearance: He is well-developed.  HENT:     Head: Normocephalic and atraumatic.     Right Ear: External ear normal.     Left Ear: External ear normal.  Eyes:     Pupils: Pupils are equal, round, and reactive to light.  Neck:     Thyroid: No thyromegaly.     Trachea: No tracheal deviation.  Cardiovascular:     Rate and Rhythm: Normal rate.  Pulmonary:     Effort: Pulmonary effort is normal.     Breath sounds: No wheezing.  Abdominal:     General: Bowel sounds are normal.     Palpations: Abdomen is soft.  Musculoskeletal:     Cervical back: Neck supple.  Skin:    General: Skin  is warm and dry.     Capillary Refill: Capillary refill takes less than 2 seconds.  Neurological:     Mental Status: He is alert and oriented to person, place, and time.  Psychiatric:        Behavior: Behavior normal.        Thought Content: Thought content normal.        Judgment: Judgment normal.     Ortho Exam positive impingement left shoulder.  Distal migration of the right biceps muscle chronic.  Acromioclavicular joint is minimally tender on the left negative crossarm test.  Positive impingement left shoulder negative drop arm test negative empty can test.  Specialty Comments:  No specialty comments available.  Imaging: No results found.   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   HTN (hypertension) 04/05/2020   HLD (hyperlipidemia) 04/05/2020   Abdominal swelling 05/05/2019   Abdominal bloating 05/05/2019   Other constipation 05/05/2019   Dyspnea 03/22/2011   Pipe smoker 03/22/2011   Foot pain 03/22/2011   Past Medical History:  Diagnosis Date   Allergy    seasonal allergies   Anxiety    on meds   Arthritis    back   Cataract    bilateral    Depression    on meds   Diverticulosis    Emphysema of lung (HCC)    uses inhaler   History of BPH    Hyperlipidemia    MIXED--on meds   Hyperplastic colonic polyp    Hypertension    UNSPE   Hypertension, benign    on meds   Hypertension, essential    Tobacco use disorder     Family History  Problem Relation Age of Onset   Lung cancer Father    Heart attack Father    Liver cancer Brother    Colon cancer Paternal Uncle    Colon polyps Son    Esophageal cancer Neg Hx    Rectal cancer Neg Hx    Stomach cancer Neg Hx     Past Surgical History:  Procedure Laterality Date   ANTERIOR CERVICAL DECOMP/DISCECTOMY FUSION N/A 12/30/2020   Procedure: ANTERIOR CERVICAL DISCECTOMY FUSION C4-5. C5-6, allograft, plate;  Surgeon: [name], MD;  Location: [facility];  Service: Orthopedics;  Laterality: N/A;   CATARACT EXTRACTION, BILATERAL     COLONOSCOPY  2020   JMP-MAC-good prep=TICS/hems/multiple TA's   COLONOSCOPY     EYE SURGERY     Cataract Surgery Both Eyes   POLYPECTOMY  2020   mulitple   ROTATOR CUFF REPAIR Right 2003   VASECTOMY     Social History   Occupational History   Not on file  Tobacco Use   Smoking status: Former    Packs/day: 1.00    Years: 61.00    Total pack years: 61.00    Types: Pipe, Cigarettes    Quit date: 02/02/2019    Years since quitting: 3.3   Smokeless tobacco: Never  Vaping Use   Vaping Use: Never used  Substance and Sexual Activity   Alcohol use: No   Drug use: Never   Sexual  activity: Not on file

## 2022-06-24 IMAGING — RF DG C-ARM 1-60 MIN
1 series · 3 of 3 positions shown · non-contrast
Comparison: Cervical spine MRI 12/01/2020.

CLINICAL DATA: Surgery, elective. Additional history provided:
Anterior cervical discectomy fusion C4-5. C5-6, allograft, plate.
Provided fluoroscopy time 19 seconds (6.32 mGy).

EXAM:
CERVICAL SPINE - 2-3 VIEW; DG C-ARM 1-60 MIN

[Series 1: run · 3 of 3 slices shown]
[im 1/3]
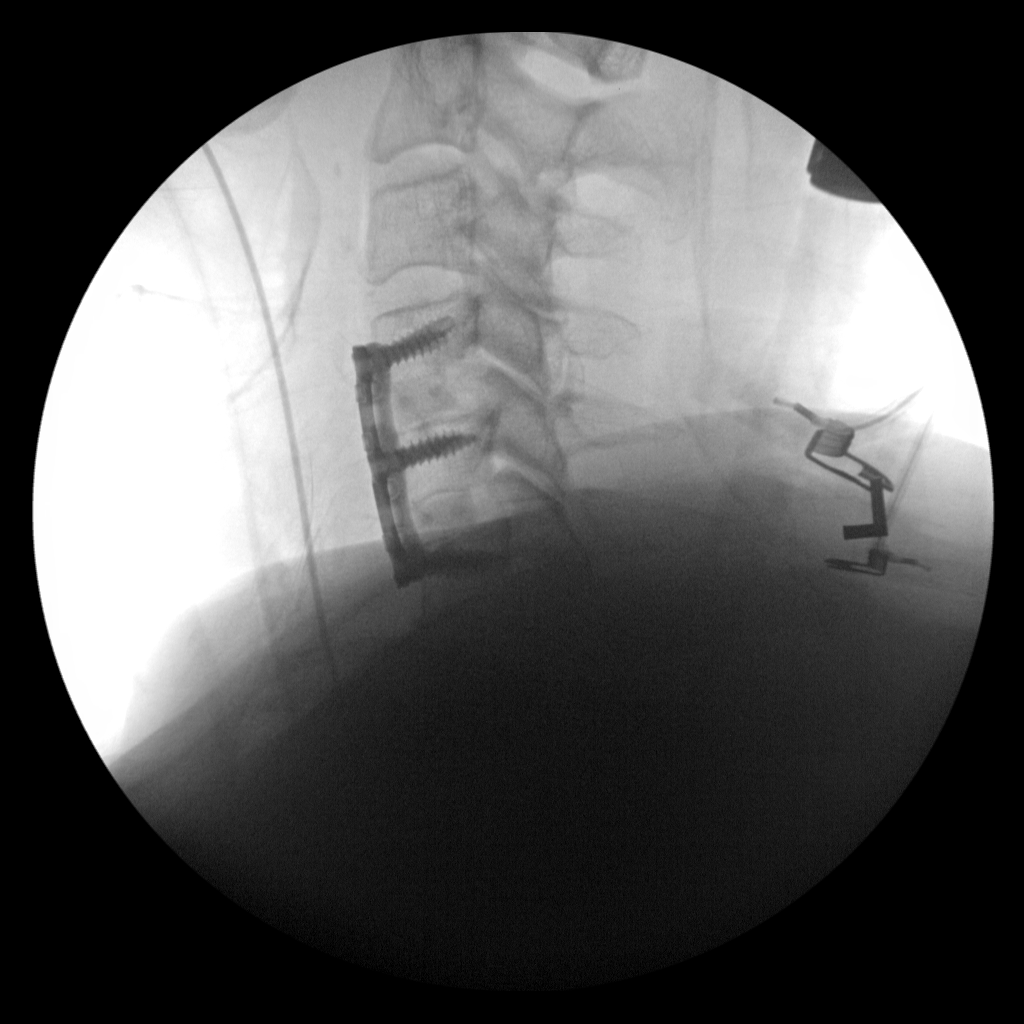
[im 2/3]
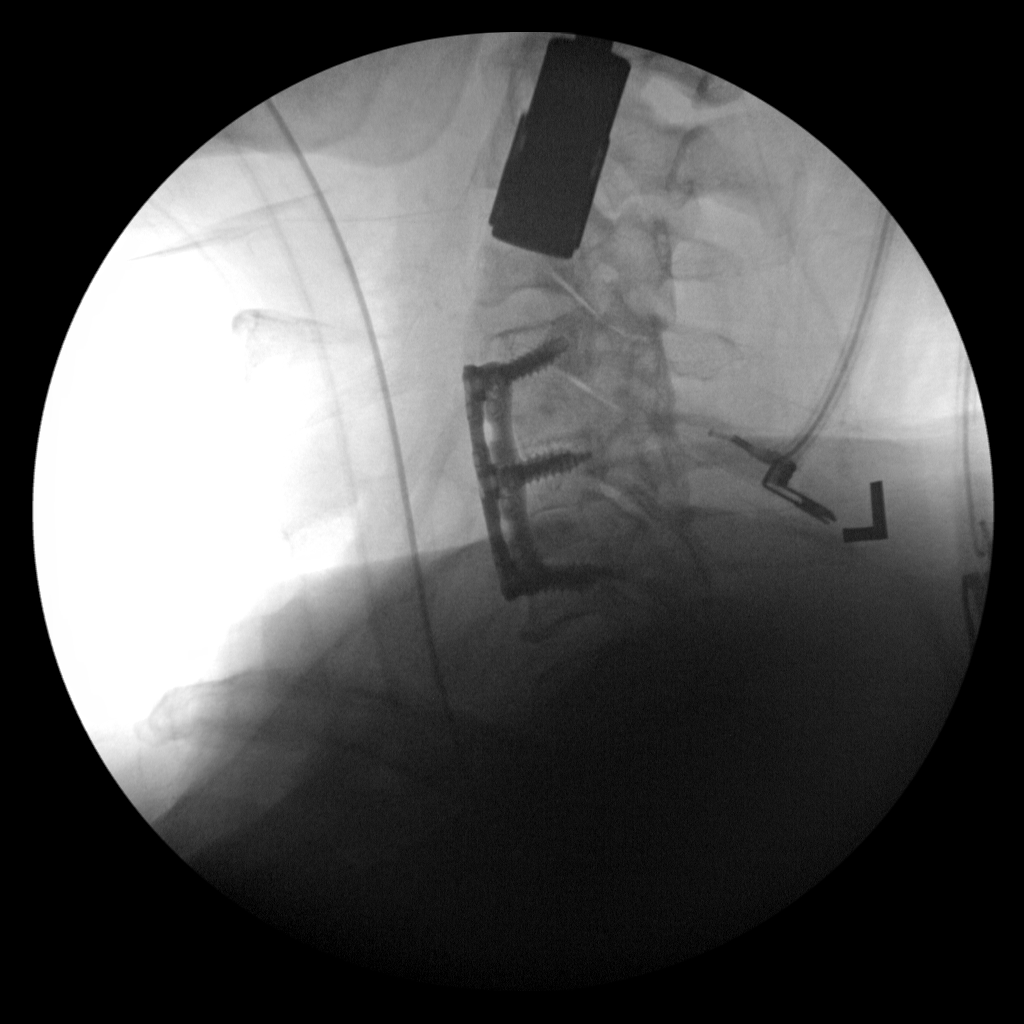
[im 3/3]
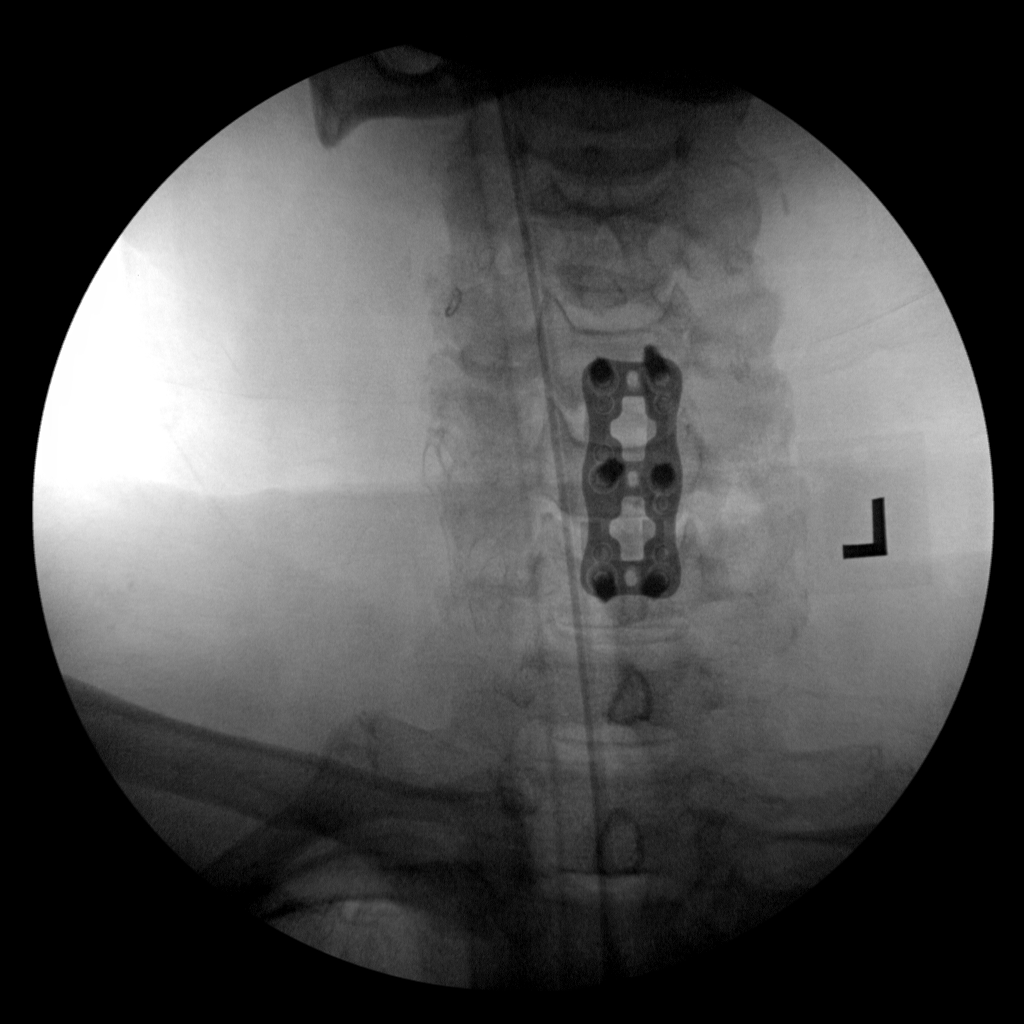

[3 of 3 positions shown; findings below may reference images not displayed]

FINDINGS: PA and lateral view intraoperative fluoroscopic images of the
cervical spine are submitted, 3 images total. On the provided
images, ACDF hardware (ventral plate and screws) is present at
C4-C6. Interbody devices are also present at C4-C5 and C5-C6. No
unexpected finding on the provided views. Partially visualized ET
tube.
IMPRESSION: Three intraoperative fluoroscopic images from C4-C6 ACDF, as
described.

## 2022-11-13 ENCOUNTER — Other Ambulatory Visit: Payer: Self-pay

## 2022-11-13 ENCOUNTER — Ambulatory Visit (INDEPENDENT_AMBULATORY_CARE_PROVIDER_SITE_OTHER): Payer: Medicare PPO

## 2022-11-13 ENCOUNTER — Ambulatory Visit: Payer: Medicare PPO | Admitting: Orthopaedic Surgery

## 2022-11-13 VITALS — BP 165/77 | HR 94 | Ht 69.0 in | Wt 223.0 lb

## 2022-11-13 DIAGNOSIS — M25512 Pain in left shoulder: Secondary | ICD-10-CM

## 2022-11-13 DIAGNOSIS — Z981 Arthrodesis status: Secondary | ICD-10-CM | POA: Diagnosis not present

## 2022-11-13 DIAGNOSIS — M75122 Complete rotator cuff tear or rupture of left shoulder, not specified as traumatic: Secondary | ICD-10-CM | POA: Diagnosis not present

## 2022-11-13 DIAGNOSIS — G8929 Other chronic pain: Secondary | ICD-10-CM

## 2022-11-13 NOTE — Progress Notes (Signed)
Office Visit Note   Patient: Johnny Brown           Date of Birth: 09-19-1945           MRN: [id number] Visit Date: 11/13/2022              Requested by: Tempie Hoist, FNP [address] [place],  [place] [address] PCP: Tempie Hoist, FNP   Assessment & Plan: Visit Diagnoses:  1. Chronic left shoulder pain   2. S/P cervical spinal fusion   3. Complete tear of left rotator cuff, unspecified whether traumatic     Plan: With previous injection good relief for more than a month in the recent fall and now inability to abduct his arm would recommend proceeding with MRI scan to rule out supraspinatus tear.  Office follow-up after scan for review.  Follow-Up Instructions: No follow-ups on file.   Orders:  Orders Placed This Encounter  Procedures  . XR Shoulder Left  . XR Cervical Spine 2 or 3 views   No orders of the defined types were placed in this encounter.     Procedures: No procedures performed   Clinical Data: No additional findings.   Subjective: Chief Complaint  Patient presents with  . Left Shoulder - Pain    HPI 77 year old male returns to the states the injection in his shoulder 05/2622 was doing well until he went rabbit hunting slipped and fell.  He fell 3 to 4 weeks ago had increased pain and has noticed decreased strength difficulty getting his arm up over his head particularly with abduction.  Review of Systems previous cervical fusion C4-5 C5-6 with pseudoarthrosis C5-6.   Objective: Vital Signs: BP (!) 165/77   Pulse 94   Ht '5\' 9"'$  (1.753 m)   Wt 223 lb (101.2 kg)   BMI 32.93 kg/m   Physical Exam  Ortho Exam  Specialty Comments:  No specialty comments available.  Imaging: XR Cervical Spine 2 or 3 views  Result Date: 11/13/2022 AP lateral cervical spine images are obtained and reviewed.  This shows C4-5 C5-6 fusion.  Appears to been some progression of bone incorporation at the lower C5-6 level in comparison to 02/23/2022 images  and CT scan 2023. Impression: Two-level cervical fusion.  There may be some further progressive bone incorporation at the C5-6 level.  C4-5 looks fully healed.  XR Shoulder Left  Result Date: 11/13/2022 3 views left shoulder obtained and reviewed this shows no glenohumeral arthritis mild acromioclavicular degenerative changes no high riding head no soft tissue calcification. Impression: Left shoulder negative for acute changes.    PMFS History: Patient Active Problem List   Diagnosis Date Noted  . Complete tear of left rotator cuff 11/13/2022  . 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 S/P cervical spinal fusion 01/11/2021  . 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 HTN (hypertension) 04/05/2020  . HLD (hyperlipidemia) 04/05/2020  . Abdominal swelling 05/05/2019  . Abdominal bloating 05/05/2019  . Other constipation 05/05/2019  . Dyspnea 03/22/2011  . Pipe smoker 03/22/2011  . Foot pain 03/22/2011   Past Medical History:  Diagnosis Date  . Allergy    seasonal allergies  . Anxiety    on meds  . Arthritis    back  . Cataract    bilateral   . Depression    on meds  . Diverticulosis   . Emphysema of lung ([place])    uses inhaler  . History of BPH   . Hyperlipidemia  MIXED--on meds  . Hyperplastic colonic polyp   . Hypertension    UNSPE  . Hypertension, benign    on meds  . Hypertension, essential   . Tobacco use disorder     Family History  Problem Relation Age of Onset  . Lung cancer Father   . Heart attack Father   . Liver cancer Brother   . Colon cancer Paternal Uncle   . Colon polyps Son   . Esophageal cancer Neg Hx   . Rectal cancer Neg Hx   . Stomach cancer Neg Hx     Past Surgical History:  Procedure Laterality Date  . ANTERIOR CERVICAL DECOMP/DISCECTOMY FUSION N/A 12/30/2020   Procedure: ANTERIOR CERVICAL DISCECTOMY FUSION C4-5. C5-6, allograft, plate;  Surgeon: [name], MD;  Location: [facility];  Service: Orthopedics;  Laterality:  N/A;  . CATARACT EXTRACTION, BILATERAL    . COLONOSCOPY  2020   JMP-MAC-good prep=TICS/hems/multiple TA's  . COLONOSCOPY    . EYE SURGERY     Cataract Surgery Both Eyes  . POLYPECTOMY  2020   mulitple  . ROTATOR CUFF REPAIR Right 2003  . VASECTOMY     Social History   Occupational History  . Not on file  Tobacco Use  . Smoking status: Former    Packs/day: 1.00    Years: 61.00    Total pack years: 61.00    Types: Pipe, Cigarettes    Quit date: 02/02/2019    Years since quitting: 3.7  . Smokeless tobacco: Never  Vaping Use  . Vaping Use: Never used  Substance and Sexual Activity  . Alcohol use: No  . Drug use: Never  . Sexual activity: Not on file

## 2022-11-14 ENCOUNTER — Telehealth: Payer: Self-pay | Admitting: Orthopaedic Surgery

## 2022-11-14 ENCOUNTER — Other Ambulatory Visit: Payer: Self-pay | Admitting: Physician Assistant

## 2022-11-14 MED ORDER — DIAZEPAM 5 MG PO TABS: 5.0000 mg | ORAL_TABLET | Freq: Four times a day (QID) | ORAL | 0 refills | Status: DC | PRN

## 2022-11-14 NOTE — Telephone Encounter (Signed)
Patient has claustrophobia and will need something before appt for MRI 03/31 please advise

## 2022-11-14 NOTE — Telephone Encounter (Signed)
Sent in and called and advised pt

## 2022-12-02 ENCOUNTER — Ambulatory Visit
Admission: RE | Admit: 2022-12-02 | Discharge: 2022-12-02 | Disposition: A | Discharge status: Home / Self Care | Payer: Medicare PPO | Source: Ambulatory Visit | Attending: Orthopaedic Surgery | Admitting: Orthopaedic Surgery

## 2022-12-02 DIAGNOSIS — G8929 Other chronic pain: Secondary | ICD-10-CM

## 2022-12-04 ENCOUNTER — Ambulatory Visit: Payer: Medicare PPO | Admitting: Orthopaedic Surgery

## 2022-12-04 ENCOUNTER — Encounter: Payer: Self-pay | Admitting: Orthopaedic Surgery

## 2022-12-04 VITALS — BP 130/77 | HR 63 | Ht 69.0 in | Wt 230.0 lb

## 2022-12-04 DIAGNOSIS — S46012D Strain of muscle(s) and tendon(s) of the rotator cuff of left shoulder, subsequent encounter: Secondary | ICD-10-CM

## 2022-12-04 NOTE — Progress Notes (Signed)
Office Visit Note   Patient: Johnny Brown           Date of Birth: [birth year]           MRN: [id number] Visit Date: 12/04/2022              Requested by: Tempie Hoist, FNP [address] [place],  [place] [address] PCP: Tempie Hoist, FNP   Assessment & Plan: Visit Diagnoses:  1. Traumatic complete tear of left rotator cuff, subsequent encounter     Plan: Patient had impingement and then had a fall since that time significant problems with abduction at least some portion of his tears posttraumatic.  His fall was in June.  Patient's failed conservative treatment and like to proceed with outpatient shoulder arthroscopy rotator cuff repair.  We discussed biceps tenodesis with MRI scan showing significant tendinosis of the biceps tendon.  May remove some portion of the distal clavicle but this does not seem to be symptomatic as he is torn rotator cuff.  Discussed outpatient surgery with interscalene block and general LMA anesthesia as an outpatient.  He understands he been in his sling postop questions were elicited and answered he understands request to proceed.  Follow-Up Instructions: No follow-ups on file.   Orders:  No orders of the defined types were placed in this encounter.  No orders of the defined types were placed in this encounter.     Procedures: No procedures performed   Clinical Data: No additional findings.   Subjective: Chief Complaint  Patient presents with   Left Shoulder - Pain, Follow-up    MRI left shoulder review    HPI 77 year old male returns with ongoing problems with left shoulder pain difficulty abducting his arm pain that bothers him at night.  He is taken meloxicam without relief had a subacromial injection and with persistent pain and weakness MRI scan has been obtained.  After patient had an injection in his shoulder he fell the following month and since that time said significant increased pain and weakness with abduction.  Review  of Systems previous two-level cervical fusion by me 12/30/2020.  Previous right rotator cuff.  By Dr. Edyth Gunnels.  Later distal long head biceps tendon tear with distal muscle migration right shoulder.  Positive for hypertension hyperlipidemia.  All other systems noncontributory to HPI.   Objective: Vital Signs: BP 130/77   Pulse 63   Ht 5\' 9"  (1.753 m)   Wt 230 lb (104.3 kg)   BMI 33.97 kg/m   Physical Exam Constitutional:      Appearance: He is well-developed.  HENT:     Head: Normocephalic and atraumatic.     Right Ear: External ear normal.     Left Ear: External ear normal.  Eyes:     Pupils: Pupils are equal, round, and reactive to light.  Neck:     Thyroid: No thyromegaly.     Trachea: No tracheal deviation.  Cardiovascular:     Rate and Rhythm: Normal rate.  Pulmonary:     Effort: Pulmonary effort is normal.     Breath sounds: No wheezing.  Abdominal:     General: Bowel sounds are normal.     Palpations: Abdomen is soft.  Musculoskeletal:     Cervical back: Neck supple.  Skin:    General: Skin is warm and dry.     Capillary Refill: Capillary refill takes less than 2 seconds.  Neurological:     Mental Status: He is alert and  oriented to person, place, and time.  Psychiatric:        Behavior: Behavior normal.        Thought Content: Thought content normal.        Judgment: Judgment normal.     Ortho Exam positive drop arm test left shoulder long head of the biceps tendon is tender.  Acromioclavicular joint which shows significant degenerative changes on MRI is minimally tender and he can reach show with crossarm test negative.  Negative liftoff test.  External rotation strength is good.  Specialty Comments:  No specialty comments available.  Imaging: Narrative & Impression  CLINICAL DATA:  Shoulder pain, chronic, rotator cuff disorder suspected, xray done   EXAM: MRI OF THE LEFT SHOULDER WITHOUT CONTRAST   TECHNIQUE: Multiplanar, multisequence MR imaging of  the shoulder was performed. No intravenous contrast was administered.   COMPARISON:  Radiograph 11/13/2022.   FINDINGS: Rotator cuff: There is high-grade tearing of the distal supraspinatus tendon with areas of full-thickness perforation proximal to the footprint (coronal T2 images 14-16). Moderate distal infraspinatus tendinosis. Teres minor tendon is intact. Distal subscapularis tendinosis with focal intermediate grade articular sided tear involving the cephalad fibers at the footprint.   Muscles: No significant muscle atrophy.   Biceps Long Head: Moderate intra-articular long head biceps tendinosis.   Acromioclavicular Joint: Moderate-severe arthropathy of the acromioclavicular joint with mild periarticular marrow edema. Mild subacromial/subdeltoid bursal fluid.   Glenohumeral Joint: No significant joint effusion.  Mild chondrosis.   Labrum: Degenerative anterior superior labral tearing. Degenerative posterior labral blunting.   Bones: No evidence of acute fracture.  No aggressive osseous lesion.   Other: No focal fluid collection.   IMPRESSION: High-grade tearing of the distal supraspinatus tendon with areas of full-thickness perforation proximal to the footprint. Moderate distal infraspinatus tendinosis. Distal subscapularis tendinosis with focal intermediate grade articular sided tear involving the cephalad fibers at the footprint. No significant muscle atrophy.   Moderate intra-articular long head biceps tendinosis.   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   Degenerative anterior superior labral tearing. Degenerative posterior labral blunting.     Electronically Signed   By: Maurine Simmering M.D.   On: 12/04/2022 10:20         PMFS History: Patient Active Problem List   Diagnosis Date Noted   Complete tear of left rotator cuff 11/13/2022   Cervical pseudoarthrosis 03/27/2022   S/P cervical spinal fusion 01/11/2021   Foraminal  stenosis of cervical region 12/06/2020   HTN (hypertension) 04/05/2020   HLD (hyperlipidemia) 04/05/2020   Abdominal swelling 05/05/2019   Abdominal bloating 05/05/2019   Other constipation 05/05/2019   Dyspnea 03/22/2011   Pipe smoker 03/22/2011   Foot pain 03/22/2011   Past Medical History:  Diagnosis Date   Allergy    seasonal allergies   Anxiety    on meds   Arthritis    back   Cataract    bilateral    Depression    on meds   Diverticulosis    Emphysema of lung    uses inhaler   History of BPH    Hyperlipidemia    MIXED--on meds   Hyperplastic colonic polyp    Hypertension    UNSPE   Hypertension, benign    on meds   Hypertension, essential    Tobacco use disorder     Family History  Problem Relation Age of Onset   Lung cancer Father    Heart attack Father    Liver cancer Brother  Colon cancer Paternal Uncle    Colon polyps Son    Esophageal cancer Neg Hx    Rectal cancer Neg Hx    Stomach cancer Neg Hx     Past Surgical History:  Procedure Laterality Date   ANTERIOR CERVICAL DECOMP/DISCECTOMY FUSION N/A 12/30/2020   Procedure: ANTERIOR CERVICAL DISCECTOMY FUSION C4-5. C5-6, allograft, plate;  Surgeon: [name], MD;  Location: [facility];  Service: Orthopedics;  Laterality: N/A;   CATARACT EXTRACTION, BILATERAL     COLONOSCOPY  2020   JMP-MAC-good prep=TICS/hems/multiple TA's   COLONOSCOPY     EYE SURGERY     Cataract Surgery Both Eyes   POLYPECTOMY  2020   mulitple   ROTATOR CUFF REPAIR Right 2003   VASECTOMY     Social History   Occupational History   Not on file  Tobacco Use   Smoking status: Former    Packs/day: 1.00    Years: 61.00    Additional pack years: 0.00    Total pack years: 61.00    Types: Pipe, Cigarettes    Quit date: 02/02/2019    Years since quitting: 3.8   Smokeless tobacco: Never  Vaping Use   Vaping Use: Never used  Substance and Sexual Activity   Alcohol use: No   Drug use: Never   Sexual activity: Not on file

## 2022-12-31 ENCOUNTER — Other Ambulatory Visit: Payer: Self-pay | Admitting: Orthopaedic Surgery

## 2022-12-31 DIAGNOSIS — M75122 Complete rotator cuff tear or rupture of left shoulder, not specified as traumatic: Secondary | ICD-10-CM | POA: Diagnosis not present

## 2022-12-31 DIAGNOSIS — M7522 Bicipital tendinitis, left shoulder: Secondary | ICD-10-CM | POA: Diagnosis not present

## 2022-12-31 MED ORDER — OXYCODONE-ACETAMINOPHEN 5-325 MG PO TABS: 1.0000 | ORAL_TABLET | Freq: Four times a day (QID) | ORAL | 0 refills | Status: AC | PRN

## 2023-01-08 ENCOUNTER — Ambulatory Visit (INDEPENDENT_AMBULATORY_CARE_PROVIDER_SITE_OTHER): Payer: Medicare PPO | Admitting: Orthopaedic Surgery

## 2023-01-08 ENCOUNTER — Encounter: Payer: Self-pay | Admitting: Orthopaedic Surgery

## 2023-01-08 VITALS — Ht 69.0 in | Wt 230.0 lb

## 2023-01-08 DIAGNOSIS — S46012S Strain of muscle(s) and tendon(s) of the rotator cuff of left shoulder, sequela: Secondary | ICD-10-CM

## 2023-01-08 NOTE — Progress Notes (Signed)
Post-Op Visit Note   Patient: Johnny Brown           Date of Birth: 03-04-1946           MRN: [id number] Visit Date: 01/08/2023 PCP: Delorse Lek, FNP   Assessment & Plan: Post biceps tenodesis rotator cuff repair left shoulder.  Portal sutures removed anterior posterior.  He had a small tear fixed with a single anchor.  Will do some gentle circles out of his sling recheck 4 weeks and we can determine physical therapy either close to his house in [place] or in [place].  He is only taking pain medication at night he can resume driving.  Chief Complaint:  Chief Complaint  Patient presents with   Left Shoulder - Routine Post Op    12/31/2022 left shoulder arthroscopy, [facility], BT   Visit Diagnoses: No diagnosis found.  Plan: Return in 4 weeks.  Follow-Up Instructions: Return in about 4 weeks (around 02/05/2023).   Orders:  No orders of the defined types were placed in this encounter.  No orders of the defined types were placed in this encounter.   Imaging: No results found.  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   HTN (hypertension) 04/05/2020   HLD (hyperlipidemia) 04/05/2020   Abdominal swelling 05/05/2019   Abdominal bloating 05/05/2019   Other constipation 05/05/2019   Dyspnea 03/22/2011   Pipe smoker 03/22/2011   Foot pain 03/22/2011   Past Medical History:  Diagnosis Date   Allergy    seasonal allergies   Anxiety    on meds   Arthritis    back   Cataract    bilateral    Depression    on meds   Diverticulosis    Emphysema of lung ([facility])    uses inhaler   History of BPH    Hyperlipidemia    MIXED--on meds   Hyperplastic colonic polyp    Hypertension    UNSPE   Hypertension, benign    on meds   Hypertension, essential    Tobacco use disorder      Family History  Problem Relation Age of Onset   Lung cancer Father    Heart attack Father    Liver cancer Brother    Colon cancer Paternal Uncle    Colon polyps Son    Esophageal cancer Neg Hx    Rectal cancer Neg Hx    Stomach cancer Neg Hx     Past Surgical History:  Procedure Laterality Date   ANTERIOR CERVICAL DECOMP/DISCECTOMY FUSION N/A 12/30/2020   Procedure: ANTERIOR CERVICAL DISCECTOMY FUSION C4-5. C5-6, allograft, plate;  Surgeon: Eldred Manges, MD;  [facility];  Service: Orthopedics;  Laterality: N/A;   CATARACT EXTRACTION, BILATERAL     COLONOSCOPY  2020   JMP-MAC-good prep=TICS/hems/multiple TA's   COLONOSCOPY     EYE SURGERY     Cataract Surgery Both Eyes   POLYPECTOMY  2020   mulitple   ROTATOR CUFF REPAIR Right 2003   VASECTOMY     Social History   Occupational History   Not on file  Tobacco Use   Smoking status: Former    Packs/day: 1.00    Years: 61.00    Additional pack years: 0.00    Total pack years: 61.00  Types: Pipe, Cigarettes    Quit date: 02/02/2019    Years since quitting: 3.9   Smokeless tobacco: Never  Vaping Use   Vaping Use: Never used  Substance and Sexual Activity   Alcohol use: No   Drug use: Never   Sexual activity: Not on file

## 2023-02-05 ENCOUNTER — Ambulatory Visit (INDEPENDENT_AMBULATORY_CARE_PROVIDER_SITE_OTHER): Payer: Medicare PPO | Admitting: Orthopaedic Surgery

## 2023-02-05 VITALS — BP 139/77

## 2023-02-05 DIAGNOSIS — S46012S Strain of muscle(s) and tendon(s) of the rotator cuff of left shoulder, sequela: Secondary | ICD-10-CM

## 2023-02-05 NOTE — Progress Notes (Signed)
Post-Op Visit Note   Patient: Johnny Brown           Date of Birth: Dec 05, 1945           MRN: [id number] Visit Date: 02/05/2023 PCP: Delorse Lek, FNP   Assessment & Plan: Postop rotator cuff.  Can already get his hand up over his head to use the rep to get the last additional degrees of abduction and flexion.  He is exercising on his own and can gradually and slowly over a month or 2 some resistive exercises final visit 6 weeks.  He is happy with the results of surgery.  Chief Complaint:  Chief Complaint  Patient presents with   Left Shoulder - Routine Post Op    12/31/2022 left shoulder arthroscopy,   Visit Diagnoses:  1. Traumatic complete tear of left rotator cuff, sequela     Plan: Return in 6 weeks.  Continue working on lower extremity work.  Follow-Up Instructions: No follow-ups on file.   Orders:  No orders of the defined types were placed in this encounter.  No orders of the defined types were placed in this encounter.   Imaging: No results found.  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   HTN (hypertension) 04/05/2020   HLD (hyperlipidemia) 04/05/2020   Abdominal swelling 05/05/2019   Abdominal bloating 05/05/2019   Other constipation 05/05/2019   Dyspnea 03/22/2011   Pipe smoker 03/22/2011   Foot pain 03/22/2011   Past Medical History:  Diagnosis Date   Allergy    seasonal allergies   Anxiety    on meds   Arthritis    back   Cataract    bilateral    Depression    on meds   Diverticulosis    Emphysema of lung (HCC)    uses inhaler   History of BPH    Hyperlipidemia    MIXED--on meds   Hyperplastic colonic polyp    Hypertension    UNSPE   Hypertension, benign    on meds   Hypertension, essential    Tobacco use disorder      Family History  Problem Relation Age of Onset   Lung cancer Father    Heart attack Father    Liver cancer Brother    Colon cancer Paternal Uncle    Colon polyps Son    Esophageal cancer Neg Hx    Rectal cancer Neg Hx    Stomach cancer Neg Hx     Past Surgical History:  Procedure Laterality Date   ANTERIOR CERVICAL DECOMP/DISCECTOMY FUSION N/A 12/30/2020   Procedure: ANTERIOR CERVICAL DISCECTOMY FUSION C4-5. C5-6, allograft, plate;  Surgeon: Eldred Manges, MD;  Location: [facility] OR;  Service: Orthopedics;  Laterality: N/A;   CATARACT EXTRACTION, BILATERAL     COLONOSCOPY  2020   JMP-MAC-good prep=TICS/hems/multiple TA's   COLONOSCOPY     EYE SURGERY     Cataract Surgery Both Eyes   POLYPECTOMY  2020   mulitple   ROTATOR CUFF REPAIR Right 2003   VASECTOMY     Social History   Occupational History   Not on file  Tobacco Use   Smoking status: Former    Packs/day: 1.00    Years: 61.00    Additional pack years: 0.00    Total pack  years: 61.00    Types: Pipe, Cigarettes    Quit date: 02/02/2019    Years since quitting: 4.0   Smokeless tobacco: Never  Vaping Use   Vaping Use: Never used  Substance and Sexual Activity   Alcohol use: No   Drug use: Never   Sexual activity: Not on file

## 2023-03-19 ENCOUNTER — Ambulatory Visit: Payer: Medicare PPO | Admitting: Orthopaedic Surgery

## 2023-03-19 ENCOUNTER — Encounter: Payer: Self-pay | Admitting: Orthopaedic Surgery

## 2023-03-19 VITALS — BP 127/64 | Ht 69.0 in | Wt 234.0 lb

## 2023-03-19 DIAGNOSIS — S46012S Strain of muscle(s) and tendon(s) of the rotator cuff of left shoulder, sequela: Secondary | ICD-10-CM

## 2023-03-19 NOTE — Progress Notes (Deleted)
Office Visit Note   Patient: Johnny Brown           Date of Birth: 18-Mar-1946           MRN: [id number] Visit Date: 03/19/2023              Requested by: Delorse Lek, FNP [address] [place],  [place] [address] PCP: Delorse Lek, FNP   Assessment & Plan: Visit Diagnoses: No diagnosis found.  Plan: ***  Follow-Up Instructions: Return if symptoms worsen or fail to improve.   Orders:  No orders of the defined types were placed in this encounter.  No orders of the defined types were placed in this encounter.     Procedures: No procedures performed   Clinical Data: No additional findings.   Subjective: Chief Complaint  Patient presents with   Left Shoulder - Follow-up    12/31/2022 left shoulder arthroscopy, RCTR    HPI  Review of Systems   Objective: Vital Signs: BP 127/64   Ht 5\' 9"  (1.753 m)   Wt 234 lb (106.1 kg)   BMI 34.56 kg/m   Physical Exam  Ortho Exam  Specialty Comments:  No specialty comments available.  Imaging: No results found.   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   HTN (hypertension) 04/05/2020   HLD (hyperlipidemia) 04/05/2020   Abdominal swelling 05/05/2019   Abdominal bloating 05/05/2019   Other constipation 05/05/2019   Dyspnea 03/22/2011   Pipe smoker 03/22/2011   Foot pain 03/22/2011   Past Medical History:  Diagnosis Date   Allergy    seasonal allergies   Anxiety    on meds   Arthritis    back   Cataract    bilateral    Depression    on meds   Diverticulosis    Emphysema of lung (HCC)    uses inhaler   History of BPH    Hyperlipidemia    MIXED--on meds   Hyperplastic colonic polyp    Hypertension    UNSPE   Hypertension, benign    on meds   Hypertension, essential     Tobacco use disorder     Family History  Problem Relation Age of Onset   Lung cancer Father    Heart attack Father    Liver cancer Brother    Colon cancer Paternal Uncle    Colon polyps Son    Esophageal cancer Neg Hx    Rectal cancer Neg Hx    Stomach cancer Neg Hx     Past Surgical History:  Procedure Laterality Date   ANTERIOR CERVICAL DECOMP/DISCECTOMY FUSION N/A 12/30/2020   Procedure: ANTERIOR CERVICAL DISCECTOMY FUSION C4-5. C5-6, allograft, plate;  Surgeon: Eldred Manges, MD;  Location: [facility] OR;  Service: Orthopedics;  Laterality: N/A;   CATARACT EXTRACTION, BILATERAL     COLONOSCOPY  2020   JMP-MAC-good prep=TICS/hems/multiple TA's   COLONOSCOPY     EYE SURGERY     Cataract Surgery Both Eyes   POLYPECTOMY  2020   mulitple   ROTATOR CUFF REPAIR Right 2003   VASECTOMY     Social History   Occupational History   Not on file  Tobacco Use   Smoking status: Former    Current packs/day: 0.00    Average  packs/day: 1 pack/day for 61.0 years (61.0 ttl pk-yrs)    Types: Pipe, Cigarettes    Start date: 02/01/1958    Quit date: 02/02/2019    Years since quitting: 4.1   Smokeless tobacco: Never  Vaping Use   Vaping status: Never Used  Substance and Sexual Activity   Alcohol use: No   Drug use: Never   Sexual activity: Not on file

## 2023-03-19 NOTE — Progress Notes (Signed)
Post-Op Visit Note   Patient: Johnny Brown           Date of Birth: 25-Mar-1946           MRN: [id number] Visit Date: 03/19/2023 PCP: Delorse Lek, FNP   Assessment & Plan: Postop rotator cuff repair.  Biceps tendon did not require surgical treatment he did have slight labral fraying but no SLAP tear.  Rotator cuff was repaired single anchor and side-to-side sutures.  Chief Complaint:  Chief Complaint  Patient presents with   Left Shoulder - Follow-up    12/31/2022 left shoulder arthroscopy, RCTR   Visit Diagnoses:  1. Traumatic complete tear of left rotator cuff, sequela     Plan: Sutures removed anterior posterior.  He is off of pain medication.  Wants to resume some yard work and go fishing with a friend again.  He can gradually increase activities and is happy with results of surgery.  He is able to get his arm up over his head and is happy with results of surgery.  Follow-Up Instructions: Return if symptoms worsen or fail to improve.   Orders:  No orders of the defined types were placed in this encounter.  No orders of the defined types were placed in this encounter.   Imaging: No results found.  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   HTN (hypertension) 04/05/2020   HLD (hyperlipidemia) 04/05/2020   Abdominal swelling 05/05/2019   Abdominal bloating 05/05/2019   Other constipation 05/05/2019   Dyspnea 03/22/2011   Pipe smoker 03/22/2011   Foot pain 03/22/2011   Past Medical History:  Diagnosis Date   Allergy    seasonal allergies   Anxiety    on meds   Arthritis    back   Cataract    bilateral    Depression    on meds   Diverticulosis    Emphysema of lung (HCC)    uses inhaler   History of BPH    Hyperlipidemia    MIXED--on meds    Hyperplastic colonic polyp    Hypertension    UNSPE   Hypertension, benign    on meds   Hypertension, essential    Tobacco use disorder     Family History  Problem Relation Age of Onset   Lung cancer Father    Heart attack Father    Liver cancer Brother    Colon cancer Paternal Uncle    Colon polyps Son    Esophageal cancer Neg Hx    Rectal cancer Neg Hx    Stomach cancer Neg Hx     Past Surgical History:  Procedure Laterality Date   ANTERIOR CERVICAL DECOMP/DISCECTOMY FUSION N/A 12/30/2020   Procedure: ANTERIOR CERVICAL DISCECTOMY FUSION C4-5. C5-6, allograft, plate;  Surgeon: Eldred Manges, MD;  Location: [facility] OR;  Service: Orthopedics;  Laterality: N/A;   CATARACT EXTRACTION, BILATERAL     COLONOSCOPY  2020   JMP-MAC-good prep=TICS/hems/multiple TA's   COLONOSCOPY     EYE SURGERY     Cataract Surgery Both Eyes   POLYPECTOMY  2020   mulitple   ROTATOR CUFF REPAIR Right 2003   VASECTOMY     Social History   Occupational History   Not on file  Tobacco Use   Smoking status: Former  Current packs/day: 0.00    Average packs/day: 1 pack/day for 61.0 years (61.0 ttl pk-yrs)    Types: Pipe, Cigarettes    Start date: 02/01/1958    Quit date: 02/02/2019    Years since quitting: 4.1   Smokeless tobacco: Never  Vaping Use   Vaping status: Never Used  Substance and Sexual Activity   Alcohol use: No   Drug use: Never   Sexual activity: Not on file

## 2023-08-08 ENCOUNTER — Telehealth: Payer: Self-pay

## 2023-08-08 DIAGNOSIS — M549 Dorsalgia, unspecified: Secondary | ICD-10-CM

## 2023-08-08 NOTE — Telephone Encounter (Signed)
Referral entered  

## 2023-08-08 NOTE — Addendum Note (Signed)
Addended by: Rogers Seeds on: 08/08/2023 02:06 PM   Modules accepted: Orders

## 2023-08-08 NOTE — Telephone Encounter (Signed)
Please see urgent referral I put in for Dr. Jordan Likes.  Dr. Ophelia Charter states patient needs to be seen next week. If Dr. Jordan Likes is unavailable, please schedule with different provider.

## 2023-08-08 NOTE — Telephone Encounter (Signed)
Wife Blake Divine called stating they were in the hospital in [place] all night. Patient fell 7 feet, knocking himself out and also breaking his back. They told him to see a neurosurgeon and she is calling you for guidance with this? She would like to talk to you. [phone] or [phone]

## 2024-05-27 ENCOUNTER — Encounter: Payer: Self-pay | Admitting: Internal Medicine

## 2024-06-19 ENCOUNTER — Telehealth: Payer: Self-pay | Admitting: Physician Assistant

## 2024-06-19 ENCOUNTER — Encounter: Payer: Self-pay | Admitting: Gastroenterology

## 2024-08-10 ENCOUNTER — Ambulatory Visit: Admitting: Gastroenterology

## 2024-09-07 ENCOUNTER — Encounter: Payer: Self-pay | Admitting: Gastroenterology

## 2024-09-07 ENCOUNTER — Ambulatory Visit: Admitting: Gastroenterology

## 2024-09-07 VITALS — BP 132/60 | HR 66 | Ht 69.0 in | Wt 224.1 lb

## 2024-09-07 DIAGNOSIS — Z09 Encounter for follow-up examination after completed treatment for conditions other than malignant neoplasm: Secondary | ICD-10-CM | POA: Diagnosis not present

## 2024-09-07 DIAGNOSIS — Z860101 Personal history of adenomatous and serrated colon polyps: Secondary | ICD-10-CM | POA: Diagnosis not present

## 2024-09-07 DIAGNOSIS — Z860100 Personal history of colon polyps, unspecified: Secondary | ICD-10-CM

## 2024-09-07 NOTE — Progress Notes (Signed)
 "  Chief Complaint: History of colon polyps Primary GI MD: Dr. Albertus  HPI: Discussed the use of AI scribe software for clinical note transcription with the patient, who gave verbal consent to proceed.  History of Present Illness   Johnny Brown is a 79 year old male with a history of multiple colonic polyps who presents for routine colonoscopy surveillance.  Last colonoscopy was performed in February 2022, with removal of two precancerous polyps.  He requests Miralax  for bowel preparation, reporting intolerance to Suprep. No current gastrointestinal symptoms, including nausea, vomiting, weight loss, rectal bleeding, or constipation. Bowel movements are soft and formed, occurring three times daily without difficulty.  Dietary restrictions prior to colonoscopy are well tolerated, with intake of broth and Jell-O as needed. Fasting before procedures does not cause issues.  No history of heart attack or stroke. Anesthesia for prior procedures has been tolerated without complications.  Family history is notable for a brother-in-law who died from colon cancer.   PREVIOUS GI WORKUP    Colonoscopy 10/2020 - One 4 mm polyp in the transverse colon, removed with a cold snare. Resected and retrieved.  - One 5 mm polyp in the descending colon, removed with a cold snare. Resected and retrieved. - Diverticulosis from ascending colon to sigmoid colon.  - Small internal hemorrhoids.  Diagnosis Surgical [P], colon, descending and tranverse, polyp (2) - SESSILE SERRATED POLYP WITHOUT CYTOLOGIC DYSPLASIA (X2, DIMINUTIVE)  Past Medical History:  Diagnosis Date   Allergy    seasonal allergies   Anxiety    on meds   Arthritis    back   Cataract    bilateral    Depression    on meds   Diverticulosis    Emphysema of lung (HCC)    uses inhaler   History of BPH    Hyperlipidemia    MIXED--on meds   Hyperplastic colonic polyp    Hypertension    UNSPE   Hypertension, benign    on meds    Hypertension, essential    Tobacco use disorder     Past Surgical History:  Procedure Laterality Date   ANTERIOR CERVICAL DECOMP/DISCECTOMY FUSION N/A 12/30/2020   Procedure: ANTERIOR CERVICAL DISCECTOMY FUSION C4-5. C5-6, allograft, plate;  Surgeon: Barbarann Oneil BROCKS, MD;  Location: [facility] OR;  Service: Orthopedics;  Laterality: N/A;   CATARACT EXTRACTION, BILATERAL     COLONOSCOPY  2020   JMP-MAC-good prep=TICS/hems/multiple TA's   COLONOSCOPY     EYE SURGERY     Cataract Surgery Both Eyes   POLYPECTOMY  2020   mulitple   ROTATOR CUFF REPAIR Right 2003   VASECTOMY      Current Outpatient Medications  Medication Sig Dispense Refill   albuterol  (VENTOLIN  HFA) 108 (90 Base) MCG/ACT inhaler Inhale 2 puffs into the lungs every 6 (six) hours as needed for wheezing or shortness of breath. 8 g 6   amLODipine  (NORVASC ) 5 MG tablet Take 1 tablet (5 mg total) by mouth daily. 90 tablet 3   doxazosin  (CARDURA ) 4 MG tablet Take 4 mg by mouth daily.     FLUoxetine  (PROZAC ) 40 MG capsule Take 40 mg by mouth daily.     losartan -hydrochlorothiazide  (HYZAAR) 100-25 MG tablet Take 1 tablet by mouth daily.      meloxicam (MOBIC) 15 MG tablet Take 1 tablet every day by oral route for 90 days.     buPROPion  (WELLBUTRIN  XL) 150 MG 24 hr tablet Take 150 mg by mouth daily. (Patient not taking: Reported  on 09/07/2024)     cetirizine (ZYRTEC) 10 MG tablet Take 10 mg by mouth daily. (Patient not taking: Reported on 09/07/2024)     gabapentin  (NEURONTIN ) 300 MG capsule Take 1 capsule (300 mg total) by mouth 3 (three) times daily. 3 times a day when necessary neuropathy pain (Patient not taking: Reported on 09/07/2024) 90 capsule 3   methocarbamol  (ROBAXIN ) 500 MG tablet Take 1 tablet (500 mg total) by mouth every 6 (six) hours as needed for muscle spasms. (Patient not taking: Reported on 09/07/2024) 60 tablet 0   oxyCODONE -acetaminophen  (PERCOCET/ROXICET) 5-325 MG tablet Take 1-2 tablets by mouth every 6 (six) hours as needed  for severe pain. (Patient not taking: Reported on 09/07/2024) 40 tablet 0   Probiotic Product (PROBIOTIC DAILY PO) Take 1 capsule by mouth daily. (Patient not taking: Reported on 09/07/2024)     simvastatin  (ZOCOR ) 20 MG tablet Take 20 mg by mouth daily at 6 PM.  (Patient not taking: Reported on 09/07/2024)     tiotropium (SPIRIVA ) 18 MCG inhalation capsule Place 18 mcg into inhaler and inhale daily. (Patient not taking: Reported on 09/07/2024)     Tiotropium Bromide -Olodaterol 2.5-2.5 MCG/ACT AERS Inhale 2 puffs into the lungs daily. (Patient not taking: Reported on 09/07/2024) 1 each 11   traMADol  (ULTRAM ) 50 MG tablet Take 1 tablet (50 mg total) by mouth every 6 (six) hours as needed. (Patient not taking: Reported on 09/07/2024) 30 tablet 0   Current Facility-Administered Medications  Medication Dose Route Frequency Provider Last Rate Last Admin   0.9 %  sodium chloride  infusion  500 mL Intravenous Once Pyrtle, Gordy HERO, MD        Allergies as of 09/07/2024 - Review Complete 09/07/2024  Allergen Reaction Noted   Lisinopril  03/22/2011    Family History  Problem Relation Age of Onset   Lung cancer Father    Heart attack Father    Liver cancer Brother    Colon cancer Paternal Uncle    Colon polyps Son    Esophageal cancer Neg Hx    Rectal cancer Neg Hx    Stomach cancer Neg Hx     Social History   Socioeconomic History   Marital status: Married    Spouse name: Not on file   Number of children: Not on file   Years of education: Not on file   Highest education level: Not on file  Occupational History   Not on file  Tobacco Use   Smoking status: Former    Current packs/day: 0.00    Average packs/day: 1 pack/day for 61.0 years (61.0 ttl pk-yrs)    Types: Pipe, Cigarettes    Start date: 02/01/1958    Quit date: 02/02/2019    Years since quitting: 5.6   Smokeless tobacco: Never  Vaping Use   Vaping status: Never Used  Substance and Sexual Activity   Alcohol use: No   Drug use: Never    Sexual activity: Not on file  Other Topics Concern   Not on file  Social History Narrative   Not on file   Social Drivers of Health   Tobacco Use: Medium Risk (09/07/2024)   Patient History    Smoking Tobacco Use: Former    Smokeless Tobacco Use: Never    Passive Exposure: Not on Actuary Strain: Not on file  Food Insecurity: Not on file  Transportation Needs: Not on file  Physical Activity: Not on file  Stress: Not on file  Social Connections:  Not on file  Intimate Partner Violence: Not on file  Depression (PHQ2-9): Not on file  Alcohol Screen: Not on file  Housing: Not on file  Utilities: Not on file  Health Literacy: Not on file    Review of Systems:    Constitutional: No weight loss, fever, chills, weakness or fatigue HEENT: Eyes: No change in vision               Ears, Nose, Throat:  No change in hearing or congestion Skin: No rash or itching Cardiovascular: No chest pain, chest pressure or palpitations   Respiratory: No SOB or cough Gastrointestinal: See HPI and otherwise negative Genitourinary: No dysuria or change in urinary frequency Neurological: No headache, dizziness or syncope Musculoskeletal: No new muscle or joint pain Hematologic: No bleeding or bruising Psychiatric: No history of depression or anxiety    Physical Exam:  Vital signs: BP 132/60 (BP Location: Left Arm, Patient Position: Sitting, Cuff Size: Normal)   Pulse 66   Ht 5' 9 (1.753 m)   Wt 224 lb 2 oz (101.7 kg)   BMI 33.10 kg/m   Constitutional: NAD, alert and cooperative Head:  Normocephalic and atraumatic. Eyes:   PEERL, EOMI. No icterus. Conjunctiva pink. Respiratory: Respirations even and unlabored. Lungs clear to auscultation bilaterally.   No wheezes, crackles, or rhonchi.  Cardiovascular:  Regular rate and rhythm. No peripheral edema, cyanosis or pallor.  Gastrointestinal:  Soft, nondistended, nontender. No rebound or guarding. Normal bowel sounds. No appreciable  masses or hepatomegaly. Rectal:  Declines Msk:  Symmetrical without gross deformities. Without edema, no deformity or joint abnormality.  Neurologic:  Alert and  oriented x4;  grossly normal neurologically.  Skin:   Dry and intact without significant lesions or rashes. Psychiatric: Oriented to person, place and time. Demonstrates good judgement and reason without abnormal affect or behaviors.  Physical Exam    RELEVANT LABS AND IMAGING: CBC    Component Value Date/Time   WBC 6.0 12/27/2020 1100   RBC 4.11 (L) 12/27/2020 1100   HGB 12.2 (L) 12/27/2020 1100   HCT 36.7 (L) 12/27/2020 1100   PLT 230 12/27/2020 1100   MCV 89.3 12/27/2020 1100   MCH 29.7 12/27/2020 1100   MCHC 33.2 12/27/2020 1100   RDW 12.9 12/27/2020 1100   LYMPHSABS 1.9 05/17/2020 1159   MONOABS 0.7 05/17/2020 1159   EOSABS 0.6 05/17/2020 1159   BASOSABS 0.0 05/17/2020 1159    CMP     Component Value Date/Time   NA 134 (L) 12/27/2020 1100   NA 142 09/19/2017 0924   K 3.9 12/27/2020 1100   CL 101 12/27/2020 1100   CO2 25 12/27/2020 1100   GLUCOSE 137 (H) 12/27/2020 1100   BUN 27 (H) 12/27/2020 1100   BUN 19 09/19/2017 0924   CREATININE 1.34 (H) 12/27/2020 1100   CALCIUM 9.3 12/27/2020 1100   GFRNONAA 56 (L) 12/27/2020 1100   GFRAA 79 09/19/2017 0924     Assessment/Plan:   History of colon polyps Colonoscopy 10/2020 with 2 SSPs, recall of 3 years. Colonoscopy 2020 with 8 Tas and 2 SSPs. Appears younger than stated age.  -- schedule colonoscopy -- I thoroughly discussed the procedure with the patient (at bedside) to include nature of the procedure, alternatives, benefits, and risks (including but not limited to bleeding, infection, perforation, anesthesia/cardiac pulmonary complications).  Patient verbalized understanding and gave verbal consent to proceed with procedure.  -- patient requesting miralax  prep: no history of constipation  Kayo Zion [facility]  09/07/2024, 12:23  PM  Cc: Blair, Diane W, FNP "

## 2024-09-07 NOTE — Patient Instructions (Signed)
 You have been scheduled for a colonoscopy. Please follow written instructions given to you at your visit today.   If you use inhalers (even only as needed), please bring them with you on the day of your procedure.  DO NOT TAKE 7 DAYS PRIOR TO TEST- Trulicity (dulaglutide) Ozempic, Wegovy (semaglutide) Mounjaro, Zepbound (tirzepatide) Bydureon Bcise (exanatide extended release)  DO NOT TAKE 1 DAY PRIOR TO YOUR TEST Rybelsus (semaglutide) Adlyxin (lixisenatide) Victoza (liraglutide) Byetta (exanatide) ___________________________________________________________________________  _______________________________________________________  If your blood pressure at your visit was 140/90 or greater, please contact your primary care physician to follow up on this.  _______________________________________________________  If you are age 79 or older, your body mass index should be between 23-30. Your Body mass index is 33.1 kg/m. If this is out of the aforementioned range listed, please consider follow up with your Primary Care Provider.  If you are age 21 or younger, your body mass index should be between 19-25. Your Body mass index is 33.1 kg/m. If this is out of the aformentioned range listed, please consider follow up with your Primary Care Provider.   ________________________________________________________  The [facility] providers would like to encourage you to use MYCHART to communicate with providers for non-urgent requests or questions.  Due to long hold times on the telephone, sending your provider a message by [facility] may be a faster and more efficient way to get a response.  Please allow 48 business hours for a response.  Please remember that this is for non-urgent requests.  _______________________________________________________  [facility] is using a team-based approach to care.  Your team is made up of your doctor and two to three APPS. Our APPS (Nurse  Practitioners and Physician Assistants) work with your physician to ensure care continuity for you. They are fully qualified to address your health concerns and develop a treatment plan. They communicate directly with your gastroenterologist to care for you. Seeing the Advanced Practice Practitioners on your physician's team can help you by facilitating care more promptly, often allowing for earlier appointments, access to diagnostic testing, procedures, and other specialty referrals.

## 2024-09-14 ENCOUNTER — Encounter: Payer: Self-pay | Admitting: Internal Medicine

## 2024-09-14 ENCOUNTER — Ambulatory Visit: Admitting: Internal Medicine

## 2024-09-14 VITALS — BP 126/72 | HR 71 | Temp 97.5°F | Resp 17 | Ht 69.0 in | Wt 224.2 lb

## 2024-09-14 DIAGNOSIS — K635 Polyp of colon: Secondary | ICD-10-CM

## 2024-09-14 DIAGNOSIS — Z860100 Personal history of colon polyps, unspecified: Secondary | ICD-10-CM

## 2024-09-14 DIAGNOSIS — D122 Benign neoplasm of ascending colon: Secondary | ICD-10-CM | POA: Diagnosis not present

## 2024-09-14 DIAGNOSIS — K5730 Diverticulosis of large intestine without perforation or abscess without bleeding: Secondary | ICD-10-CM | POA: Diagnosis not present

## 2024-09-14 DIAGNOSIS — K648 Other hemorrhoids: Secondary | ICD-10-CM | POA: Diagnosis not present

## 2024-09-14 DIAGNOSIS — D123 Benign neoplasm of transverse colon: Secondary | ICD-10-CM | POA: Diagnosis not present

## 2024-09-14 DIAGNOSIS — D124 Benign neoplasm of descending colon: Secondary | ICD-10-CM | POA: Diagnosis not present

## 2024-09-14 DIAGNOSIS — Z1211 Encounter for screening for malignant neoplasm of colon: Secondary | ICD-10-CM | POA: Diagnosis not present

## 2024-09-14 DIAGNOSIS — Z860101 Personal history of adenomatous and serrated colon polyps: Secondary | ICD-10-CM | POA: Diagnosis not present

## 2024-09-14 MED ORDER — SODIUM CHLORIDE 0.9 % IV SOLN: 500.0000 mL | INTRAVENOUS | Status: AC

## 2024-09-14 NOTE — Progress Notes (Signed)
 Transferred to PACU via stretcher. Patient arousing to stimulation.  VSS upon leaving procedure room.

## 2024-09-14 NOTE — Progress Notes (Signed)
 Pt's states no medical or surgical changes since previsit or office visit.

## 2024-09-14 NOTE — Progress Notes (Signed)
 Called to room to assist during endoscopic procedure.  Patient ID and intended procedure confirmed with present staff. Received instructions for my participation in the procedure from the performing physician.

## 2024-09-14 NOTE — Patient Instructions (Signed)

## 2024-09-14 NOTE — Progress Notes (Signed)
 "   GASTROENTEROLOGY PROCEDURE H&P NOTE   Primary Care Physician: Almeda Loa ORN, FNP    Reason for Procedure:  History of multiple adenomatous and sessile serrated polyps  Plan:    Colonoscopy  Patient is appropriate for endoscopic procedure(s) in the ambulatory ([facility]) setting.  The nature of the procedure, as well as the risks, benefits, and alternatives were carefully and thoroughly reviewed with the patient. Ample time for discussion and questions allowed.  All questions were answered. The patient understood, was satisfied, and agreed with the plan to proceed.    HPI: Johnny Brown is a 79 y.o. male who presents for surveillance colonoscopy.  Medical history as below.  Tolerated the prep.  No recent chest pain or shortness of breath.  No abdominal pain today.  Past Medical History:  Diagnosis Date   Allergy    seasonal allergies   Anxiety    on meds   Arthritis    back   Cataract    bilateral    Depression    on meds   Diverticulosis    Emphysema of lung (HCC)    uses inhaler   History of BPH    Hyperlipidemia    MIXED--on meds   Hyperplastic colonic polyp    Hypertension    UNSPE   Hypertension, benign    on meds   Hypertension, essential    Tobacco use disorder     Past Surgical History:  Procedure Laterality Date   ANTERIOR CERVICAL DECOMP/DISCECTOMY FUSION N/A 12/30/2020   Procedure: ANTERIOR CERVICAL DISCECTOMY FUSION C4-5. C5-6, allograft, plate;  Surgeon: Barbarann Oneil BROCKS, MD;  Location: [facility] OR;  Service: Orthopedics;  Laterality: N/A;   CATARACT EXTRACTION, BILATERAL     COLONOSCOPY  2020   JMP-MAC-good prep=TICS/hems/multiple TA's   COLONOSCOPY     EYE SURGERY     Cataract Surgery Both Eyes   POLYPECTOMY  2020   mulitple   ROTATOR CUFF REPAIR Right 2003   VASECTOMY      Prior to Admission medications  Medication Sig Start Date End Date Taking? Authorizing Provider  amLODipine  (NORVASC ) 5 MG tablet Take 1 tablet (5 mg total) by mouth daily.  04/05/20  Yes Nahser, Aleene PARAS, MD  buPROPion  (WELLBUTRIN  XL) 150 MG 24 hr tablet Take 150 mg by mouth daily.   Yes [provider]  losartan -hydrochlorothiazide  (HYZAAR) 100-25 MG tablet Take 1 tablet by mouth daily.  03/09/16  Yes [provider]  albuterol  (VENTOLIN  HFA) 108 (90 Base) MCG/ACT inhaler Inhale 2 puffs into the lungs every 6 (six) hours as needed for wheezing or shortness of breath. Patient not taking: Reported on 09/14/2024 05/17/20   Hunsucker, Donnice SAUNDERS, MD  cetirizine (ZYRTEC) 10 MG tablet Take 10 mg by mouth daily. Patient not taking: Reported on 09/07/2024    [provider]  doxazosin  (CARDURA ) 4 MG tablet Take 4 mg by mouth daily. Patient not taking: Reported on 09/14/2024    [provider]  FLUoxetine  (PROZAC ) 40 MG capsule Take 40 mg by mouth daily. Patient not taking: Reported on 09/14/2024 01/21/16   [provider]  gabapentin  (NEURONTIN ) 300 MG capsule Take 1 capsule (300 mg total) by mouth 3 (three) times daily. 3 times a day when necessary neuropathy pain Patient not taking: Reported on 09/07/2024 11/23/20   Persons, Ronal Dragon, PA  meloxicam (MOBIC) 15 MG tablet Take 1 tablet every day by oral route for 90 days.    [provider]  methocarbamol  (ROBAXIN ) 500  MG tablet Take 1 tablet (500 mg total) by mouth every 6 (six) hours as needed for muscle spasms. Patient not taking: Reported on 09/07/2024 12/30/20   Quin Lynwood HERO, PA-C  oxyCODONE -acetaminophen  (PERCOCET/ROXICET) 5-325 MG tablet Take 1-2 tablets by mouth every 6 (six) hours as needed for severe pain. Patient not taking: Reported on 09/07/2024 12/31/22   Barbarann Oneil BROCKS, MD  Probiotic Product (PROBIOTIC DAILY PO) Take 1 capsule by mouth daily. Patient not taking: Reported on 01/05/2022    [provider]  simvastatin  (ZOCOR ) 20 MG tablet Take 20 mg by mouth daily at 6 PM.  Patient not taking: No sig reported 01/20/16   [provider]  tiotropium (SPIRIVA ) 18  MCG inhalation capsule Place 18 mcg into inhaler and inhale daily. Patient not taking: Reported on 01/05/2022    [provider]  Tiotropium Bromide -Olodaterol 2.5-2.5 MCG/ACT AERS Inhale 2 puffs into the lungs daily. Patient not taking: Reported on 09/07/2024 11/14/20   Hunsucker, Donnice SAUNDERS, MD  traMADol  (ULTRAM ) 50 MG tablet Take 1 tablet (50 mg total) by mouth every 6 (six) hours as needed. Patient not taking: Reported on 09/07/2024 01/11/21   Barbarann Oneil BROCKS, MD    Current Outpatient Medications  Medication Sig Dispense Refill   amLODipine  (NORVASC ) 5 MG tablet Take 1 tablet (5 mg total) by mouth daily. 90 tablet 3   buPROPion  (WELLBUTRIN  XL) 150 MG 24 hr tablet Take 150 mg by mouth daily.     losartan -hydrochlorothiazide  (HYZAAR) 100-25 MG tablet Take 1 tablet by mouth daily.      albuterol  (VENTOLIN  HFA) 108 (90 Base) MCG/ACT inhaler Inhale 2 puffs into the lungs every 6 (six) hours as needed for wheezing or shortness of breath. (Patient not taking: Reported on 09/14/2024) 8 g 6   cetirizine (ZYRTEC) 10 MG tablet Take 10 mg by mouth daily. (Patient not taking: Reported on 09/07/2024)     doxazosin  (CARDURA ) 4 MG tablet Take 4 mg by mouth daily. (Patient not taking: Reported on 09/14/2024)     FLUoxetine  (PROZAC ) 40 MG capsule Take 40 mg by mouth daily. (Patient not taking: Reported on 09/14/2024)     gabapentin  (NEURONTIN ) 300 MG capsule Take 1 capsule (300 mg total) by mouth 3 (three) times daily. 3 times a day when necessary neuropathy pain (Patient not taking: Reported on 09/07/2024) 90 capsule 3   meloxicam (MOBIC) 15 MG tablet Take 1 tablet every day by oral route for 90 days.     methocarbamol  (ROBAXIN ) 500 MG tablet Take 1 tablet (500 mg total) by mouth every 6 (six) hours as needed for muscle spasms. (Patient not taking: Reported on 09/07/2024) 60 tablet 0   oxyCODONE -acetaminophen  (PERCOCET/ROXICET) 5-325 MG tablet Take 1-2 tablets by mouth every 6 (six) hours as needed for severe pain.  (Patient not taking: Reported on 09/07/2024) 40 tablet 0   Probiotic Product (PROBIOTIC DAILY PO) Take 1 capsule by mouth daily. (Patient not taking: Reported on 01/05/2022)     simvastatin  (ZOCOR ) 20 MG tablet Take 20 mg by mouth daily at 6 PM.  (Patient not taking: No sig reported)     tiotropium (SPIRIVA ) 18 MCG inhalation capsule Place 18 mcg into inhaler and inhale daily. (Patient not taking: Reported on 01/05/2022)     Tiotropium Bromide -Olodaterol 2.5-2.5 MCG/ACT AERS Inhale 2 puffs into the lungs daily. (Patient not taking: Reported on 09/07/2024) 1 each 11   traMADol  (ULTRAM ) 50 MG tablet Take 1 tablet (50 mg total) by mouth every 6 (six) hours  as needed. (Patient not taking: Reported on 09/07/2024) 30 tablet 0   Current Facility-Administered Medications  Medication Dose Route Frequency Provider Last Rate Last Admin   0.9 %  sodium chloride  infusion  500 mL Intravenous Once Sianne Tejada, Gordy HERO, MD       0.9 %  sodium chloride  infusion  500 mL Intravenous Continuous Neita Landrigan, Gordy HERO, MD        Allergies as of 09/14/2024   (No Active Allergies)    Family History  Problem Relation Age of Onset   Lung cancer Father    Heart attack Father    Liver cancer Brother    Colon cancer Paternal Uncle    Colon polyps Son    Esophageal cancer Neg Hx    Rectal cancer Neg Hx    Stomach cancer Neg Hx     Social History   Socioeconomic History   Marital status: Married    Spouse name: Not on file   Number of children: Not on file   Years of education: Not on file   Highest education level: Not on file  Occupational History   Not on file  Tobacco Use   Smoking status: Former    Current packs/day: 0.00    Average packs/day: 1 pack/day for 61.0 years (61.0 ttl pk-yrs)    Types: Pipe, Cigarettes    Start date: 02/01/1958    Quit date: 02/02/2019    Years since quitting: 5.6   Smokeless tobacco: Never  Vaping Use   Vaping status: Never Used  Substance and Sexual Activity   Alcohol use: No   Drug use:  Never   Sexual activity: Not on file  Other Topics Concern   Not on file  Social History Narrative   Not on file   Social Drivers of Health   Tobacco Use: Medium Risk (09/14/2024)   Patient History    Smoking Tobacco Use: Former    Smokeless Tobacco Use: Never    Passive Exposure: Not on Actuary Strain: Not on file  Food Insecurity: Not on file  Transportation Needs: Not on file  Physical Activity: Not on file  Stress: Not on file  Social Connections: Not on file  Intimate Partner Violence: Not on file  Depression (PHQ2-9): Not on file  Alcohol Screen: Not on file  Housing: Not on file  Utilities: Not on file  Health Literacy: Not on file    Physical Exam: Vital signs in last 24 hours: @BP  (!) 137/53   Pulse 62   Temp (!) 97.5 F (36.4 C) (Temporal)   Ht 5' 9 (1.753 m)   Wt 224 lb 3.2 oz (101.7 kg)   SpO2 98%   BMI 33.11 kg/m  GEN: NAD EYE: Sclerae anicteric ENT: MMM CV: Non-tachycardic Pulm: CTA b/l GI: Soft, NT/ND NEURO:  Alert & Oriented x 3   Gordy Starch, MD [facility]  09/14/2024 11:44 AM  "

## 2024-09-14 NOTE — Op Note (Signed)
 [facility] Patient Name: Johnny Brown Procedure Date: 09/14/2024 11:44 AM MRN: [id number] Endoscopist: Gordy CHRISTELLA Starch , MD, [id number] Age: 79 Referring MD:  Date of Birth: [birth year] Gender: Male Account #: [id number] Procedure:                Colonoscopy Indications:              High risk colon cancer surveillance: Personal                            history of multiple adenomas and serrated polyps,                            Last colonoscopy: February 2022 (SSP x 2), Sept                            2020 (> 10 TA's and SSPs) Medicines:                Monitored Anesthesia Care Procedure:                Pre-Anesthesia Assessment:                           - Prior to the procedure, a History and Physical                            was performed, and patient medications and                            allergies were reviewed. The patient's tolerance of                            previous anesthesia was also reviewed. The risks                            and benefits of the procedure and the sedation                            options and risks were discussed with the patient.                            All questions were answered, and informed consent                            was obtained. Prior Anticoagulants: The patient has                            taken no anticoagulant or antiplatelet agents. ASA                            Grade Assessment: II - A patient with mild systemic                            disease. After reviewing the risks and benefits,  the patient was deemed in satisfactory condition to                            undergo the procedure.                           After obtaining informed consent, the colonoscope                            was passed under direct vision. Throughout the                            procedure, the patient's blood pressure, pulse, and                            oxygen saturations were monitored  continuously. The                            CF [id number] #[id number] was introduced through the anus                            and advanced to the cecum, identified by                            appendiceal orifice and ileocecal valve. The                            colonoscopy was performed without difficulty. The                            patient tolerated the procedure well. The quality                            of the bowel preparation was good. The ileocecal                            valve, appendiceal orifice, and rectum were                            photographed. Scope In: 11:53:47 AM Scope Out: 12:13:42 PM Scope Withdrawal Time: 0 hours 17 minutes 7 seconds  Total Procedure Duration: 0 hours 19 minutes 55 seconds  Findings:                 The digital rectal exam was normal.                           A 5 mm polyp was found in the ascending colon. The                            polyp was sessile. The polyp was removed with a                            cold snare. Resection and retrieval were complete.  A 5 mm polyp was found in the transverse colon. The                            polyp was sessile. The polyp was removed with a                            cold snare. Resection and retrieval were complete.                           Two sessile polyps were found in the descending                            colon. The polyps were 4 to 5 mm in size. These                            polyps were removed with a cold snare. Resection                            and retrieval were complete.                           Multiple medium-mouthed and small-mouthed                            diverticula were found in the sigmoid colon,                            descending colon, transverse colon and ascending                            colon.                           Internal hemorrhoids were found during                            retroflexion. The hemorrhoids were  small. Complications:            No immediate complications. Estimated Blood Loss:     Estimated blood loss was minimal. Impression:               - One 5 mm polyp in the ascending colon, removed                            with a cold snare. Resected and retrieved.                           - One 5 mm polyp in the transverse colon, removed                            with a cold snare. Resected and retrieved.                           - Two 4 to 5 mm polyps in the descending  colon,                            removed with a cold snare. Resected and retrieved.                           - Moderate diverticulosis in the sigmoid colon, in                            the descending colon, in the transverse colon and                            in the ascending colon.                           - Small internal hemorrhoids. Recommendation:           - Patient has a contact number available for                            emergencies. The signs and symptoms of potential                            delayed complications were discussed with the                            patient. Return to normal activities tomorrow.                            Written discharge instructions were provided to the                            patient.                           - Resume previous diet.                           - Continue present medications.                           - Await pathology results.                           - If abdominal gas and bloating persist, empiric                            treatment for SIBO can be considered.                           - No recommendation at this time regarding repeat                            colonoscopy due to age at next surveillance                            interval.  Gordy CHRISTELLA Starch, MD 09/14/2024 12:18:52 PM This report has been signed electronically.

## 2024-09-15 ENCOUNTER — Telehealth: Payer: Self-pay | Admitting: *Deleted

## 2024-09-15 NOTE — Telephone Encounter (Signed)
 No answer for follow up call. Left a message.

## 2024-09-17 LAB — SURGICAL PATHOLOGY

## 2024-09-18 ENCOUNTER — Ambulatory Visit: Payer: Self-pay | Admitting: Internal Medicine

## 2024-09-25 ENCOUNTER — Ambulatory Visit: Admitting: Cardiology

## 2024-12-25 ENCOUNTER — Ambulatory Visit: Admitting: Cardiology
